# Patient Record
Sex: Female | Born: 1935 | Race: White | Hispanic: No | Smoking: Never smoker
Health system: Southern US, Community
[De-identification: ages and names within clinical notes are randomized; demographics above are authoritative.]

## PROBLEM LIST (undated history)

## (undated) DIAGNOSIS — F419 Anxiety disorder, unspecified: Secondary | ICD-10-CM

## (undated) DIAGNOSIS — T7840XA Allergy, unspecified, initial encounter: Secondary | ICD-10-CM

## (undated) DIAGNOSIS — F0390 Unspecified dementia without behavioral disturbance: Secondary | ICD-10-CM

## (undated) DIAGNOSIS — E785 Hyperlipidemia, unspecified: Secondary | ICD-10-CM

## (undated) DIAGNOSIS — F32A Depression, unspecified: Secondary | ICD-10-CM

## (undated) DIAGNOSIS — H269 Unspecified cataract: Secondary | ICD-10-CM

## (undated) DIAGNOSIS — M1990 Unspecified osteoarthritis, unspecified site: Secondary | ICD-10-CM

## (undated) HISTORY — DX: Anxiety disorder, unspecified: F41.9

## (undated) HISTORY — PX: MINOR HEMORRHOIDECTOMY: SHX6238

## (undated) HISTORY — DX: Unspecified dementia, unspecified severity, without behavioral disturbance, psychotic disturbance, mood disturbance, and anxiety: F03.90

## (undated) HISTORY — PX: TUBAL LIGATION: SHX77

## (undated) HISTORY — DX: Allergy, unspecified, initial encounter: T78.40XA

## (undated) HISTORY — DX: Hyperlipidemia, unspecified: E78.5

## (undated) HISTORY — DX: Depression, unspecified: F32.A

## (undated) HISTORY — DX: Unspecified osteoarthritis, unspecified site: M19.90

## (undated) HISTORY — DX: Unspecified cataract: H26.9

---

## 2016-10-05 DIAGNOSIS — M1712 Unilateral primary osteoarthritis, left knee: Secondary | ICD-10-CM | POA: Insufficient documentation

## 2017-08-05 DIAGNOSIS — H2511 Age-related nuclear cataract, right eye: Secondary | ICD-10-CM | POA: Insufficient documentation

## 2018-10-23 ENCOUNTER — Ambulatory Visit: Payer: Medicare Other | Admitting: Licensed Clinical Social Worker

## 2018-10-23 ENCOUNTER — Other Ambulatory Visit: Payer: Self-pay

## 2018-10-29 ENCOUNTER — Ambulatory Visit: Payer: Medicare Other | Admitting: Licensed Clinical Social Worker

## 2018-10-29 ENCOUNTER — Other Ambulatory Visit: Payer: Self-pay

## 2018-11-25 ENCOUNTER — Ambulatory Visit: Payer: Self-pay | Admitting: Psychiatry

## 2018-12-17 ENCOUNTER — Other Ambulatory Visit: Payer: Self-pay

## 2018-12-17 ENCOUNTER — Ambulatory Visit (INDEPENDENT_AMBULATORY_CARE_PROVIDER_SITE_OTHER): Payer: Medicare Other | Admitting: Psychiatry

## 2018-12-17 ENCOUNTER — Encounter: Payer: Self-pay | Admitting: Psychiatry

## 2018-12-17 DIAGNOSIS — F330 Major depressive disorder, recurrent, mild: Secondary | ICD-10-CM | POA: Insufficient documentation

## 2018-12-17 DIAGNOSIS — F320 Major depressive disorder, single episode, mild: Secondary | ICD-10-CM

## 2018-12-17 DIAGNOSIS — F419 Anxiety disorder, unspecified: Secondary | ICD-10-CM

## 2018-12-17 DIAGNOSIS — K219 Gastro-esophageal reflux disease without esophagitis: Secondary | ICD-10-CM | POA: Insufficient documentation

## 2018-12-17 DIAGNOSIS — E785 Hyperlipidemia, unspecified: Secondary | ICD-10-CM | POA: Insufficient documentation

## 2018-12-17 DIAGNOSIS — F324 Major depressive disorder, single episode, in partial remission: Secondary | ICD-10-CM | POA: Insufficient documentation

## 2018-12-17 DIAGNOSIS — I493 Ventricular premature depolarization: Secondary | ICD-10-CM | POA: Insufficient documentation

## 2018-12-17 DIAGNOSIS — N952 Postmenopausal atrophic vaginitis: Secondary | ICD-10-CM | POA: Insufficient documentation

## 2018-12-17 MED ORDER — MIRTAZAPINE 30 MG PO TABS: 30.0000 mg | ORAL_TABLET | Freq: Every day | ORAL | 1 refills | Status: DC

## 2018-12-17 NOTE — Progress Notes (Signed)
Virtual Visit via Video Note  I connected with Dorothy Olson on 12/17/18 at  3:00 PM EDT by a video enabled telemedicine application and verified that I am speaking with the correct person using two identifiers.   I discussed the limitations of evaluation and management by telemedicine and the availability of in person appointments. The patient expressed understanding and agreed to proceed.   I discussed the assessment and treatment plan with the patient. The patient was provided an opportunity to ask questions and all were answered. The patient agreed with the plan and demonstrated an understanding of the instructions.   The patient was advised to call back or seek an in-person evaluation if the symptoms worsen or if the condition fails to improve as anticipated.    Psychiatric Initial Adult Assessment   Patient Identification: Dorothy Olson MRN:  [id number] Date of Evaluation:  12/17/2018 Referral Source: Valma CavaMichelle Beckham MD Chief Complaint:   Chief Complaint    Establish Care; Depression; Other; Weight Loss     Visit Diagnosis:    ICD-10-CM   1. MDD (major depressive disorder), single episode, mild (HCC)  F32.0   2. Anxiety disorder, unspecified type  F41.9     History of Present Illness:  Dorothy Olson is an 83 year old Caucasian female, widowed , [place] with her daughter Judeth CornfieldStephanie, has a history of anxiety was evaluated by telemedicine today.  Patient being a limited historian, daughter Judeth CornfieldStephanie provided collateral information and assisted in the evaluation today.  Per daughter patient was very active up until March 2020.  After the COVID-19 crisis started, she started decompensating.  She was an active member of her church, would go out shopping, would go out and make friends, go out for dinner and other activities on a regular basis.  However currently she does not want to go out anywhere at all.  She is very withdrawn, does not talk much.  She has no motivation to do  anything.  She needs to be pushed to eat her meals, she lost a lot of weight.  She denies any suicidal thoughts.  She was recently started on mirtazapine on 10 August.  However they have not noticed much benefit from the medication yet.  She has a history of being on Xanax in the past.  She currently takes Xanax 1 mg at bedtime.  She was on a higher dosage previously.  However when the mirtazapine was added her dosage was decreased.  Daughter reports she is worried about patient at this time.  She hence brought her to stay with her temporarily while she gets better.  Patient currently reports sadness, some sleep issues, decreased appetite.  She denies any suicidality.  She denies any homicidality.  She denies any perceptual disturbances.  Patient reports she is anxious however does not elaborate on it.  She does have a history of anxiety in the past.  As documented above after the death of her husband in 2011 she was started on Xanax.  She saw a psychiatrist briefly however did not continue care at that time.  Patient denies any history of trauma.  Patient appeared to be alert, oriented to person place time and situation.  Patient also was able to answer 3 word questing for memory- immediate 3 out of 3, after 5 minutes 3 out of 3.  Her attention and focus appear to be good.    Patient denies any significant medical problems other than hyperlipidemia.  Patient denies any substance abuse problems.  Patient does report psychosocial  stressors of the COVID-19 outbreak, not being able to go to church, having a relationship break-up recently with a female friend that she used to be with.  She did not elaborate on it.  She does report she is sad about that.  Associated Signs/Symptoms: Depression Symptoms:  depressed mood, anhedonia, psychomotor retardation, fatigue, anxiety, decreased appetite, (Hypo) Manic Symptoms:  denies Anxiety Symptoms:  anxiety unspecified Psychotic Symptoms:  denies PTSD  Symptoms: Negative  Past Psychiatric History: Patient with history of anxiety-denies inpatient mental health admissions in the past.  Patient denies suicide attempts.  She was recently started on mirtazapine by her primary care provider.  Previous Psychotropic Medications: Yes Xanax, Remeron.  Substance Abuse History in the last 12 months:  No.  Consequences of Substance Abuse: Negative  Past Medical History:  Past Medical History:  Diagnosis Date  . Anxiety   . Hyperlipidemia     Past Surgical History:  Procedure Laterality Date  . TUBAL LIGATION      Family Psychiatric History: Denies history of mental health problems in her family.  Family History:  Family History  Problem Relation Age of Onset  . Mental illness Neg Hx     Social History:   Social History   Socioeconomic History  . Marital status: Widowed    Spouse name: Not on file  . Number of children: 2  . Years of education: Not on file  . Highest education level: Not on file  Occupational History  . Not on file  Social Needs  . Financial resource strain: Not hard at all  . Food insecurity    Worry: Never true    Inability: Never true  . Transportation needs    Medical: No    Non-medical: No  Tobacco Use  . Smoking status: Never Smoker  . Smokeless tobacco: Never Used  Substance and Sexual Activity  . Alcohol use: Not Currently    Frequency: Never  . Drug use: Not Currently  . Sexual activity: Not Currently  Lifestyle  . Physical activity    Days per week: 0 days    Minutes per session: 0 min  . Stress: To some extent  Relationships  . Social Musicianconnections    Talks on phone: Not on file    Gets together: Not on file    Attends religious service: More than 4 times per year    Active member of club or organization: No    Attends meetings of clubs or organizations: Never    Relationship status: Widowed  Other Topics Concern  . Not on file  Social History Narrative  . Not on file     Additional Social History: Patient is widowed.  She has a son and a daughter.  She graduated high school.  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  Allergies:   Allergies  Allergen Reactions  . Amoxicillin-Pot Clavulanate Other (See Comments)  . Celexa  [Citalopram Hydrobromide] Nausea And Vomiting  . Escitalopram Oxalate Other (See Comments)  . Amoxicillin Rash  . Bupropion Other (See Comments) and Nausea Only  . Citalopram Nausea Only  . Sulfa Antibiotics Rash    Metabolic Disorder Labs: No results found for: HGBA1C, MPG No results found for: PROLACTIN No results found for: CHOL, TRIG, HDL, CHOLHDL, VLDL, LDLCALC No results found for: TSH  Therapeutic Level Labs: No results found for: LITHIUM No results found for: CBMZ No results found for: VALPROATE  Current Medications: Current Outpatient  Medications  Medication Sig Dispense Refill  . ALPRAZolam (XANAX) 1 MG tablet     . calcium citrate-vitamin D (CITRACAL+D) 315-200 MG-UNIT tablet Take by mouth.    . estradiol (ESTRACE VAGINAL) 0.1 MG/GM vaginal cream apply a small amount to vagina at night weekly    . Multiple Vitamins-Minerals (PRESERVISION AREDS) CAPS     . omeprazole (PRILOSEC) 20 MG capsule 1 capsule    . Potassium 99 MG TABS 1 tablet    . vitamin E 400 UNIT capsule 1 capsule    . mirtazapine (REMERON) 30 MG tablet Take 1 tablet (30 mg total) by mouth at bedtime. 30 tablet 1   No current facility-administered medications for this visit.     Musculoskeletal: Strength & Muscle Tone: UTA Gait & Station: normal Patient leans: N/A  Psychiatric Specialty Exam: Review of Systems  Psychiatric/Behavioral: Positive for depression. The patient is nervous/anxious.   All other systems reviewed and are negative.   There were no vitals taken for this visit.There is no height or weight on file to calculate BMI.  General Appearance: Casual  Eye Contact:  Fair  Speech:   Clear and Coherent  Volume:  Normal  Mood:  Anxious and Depressed  Affect:  Appropriate  Thought Process:  Goal Directed and Descriptions of Associations: Intact  Orientation:  Full (Time, Place, and Person)  Thought Content:  Logical  Suicidal Thoughts:  No  Homicidal Thoughts:  No  Memory:  Immediate;   Fair Recent;   Fair Remote;   Fair  Judgement:  Fair  Insight:  Good  Psychomotor Activity:  Normal  Concentration:  Concentration: Fair and Attention Span: Fair  Recall:  [employer]Fair  Fund of Knowledge:Fair  Language: Fair  Akathisia:  No  Handed:  Right  AIMS (if indicated): UTA  Assets:  Communication Skills Desire for Improvement Housing Social Support  ADL's:  Intact  Cognition: WNL  Sleep:  Fair   Screenings:   Assessment and Plan: Dorothy Olson is an 83 year old Caucasian female, retired, lives currently with her daughter in [place], has a history of anxiety, hyperlipidemia was evaluated by telemedicine today.  Patient is currently struggling with psychosocial stressors of the COVID-19 outbreak, relationship struggles.  She denies suicidality.  She denies any substance abuse problems.  She denies family history of mental health problems.  She has good social support from her daughter with whom she stays now.  Patient will benefit from the following plan.  Plan MDD- unstable Increase mirtazapine to 30 mg p.o. nightly Discussed starting Abilify however discussed with patient to get an EKG.  Will order EKG.  She will get it from her primary medical doctor.  For anxiety disorder unspecified- unstable Mirtazapine will help.  We will get the following labs-vitamin B12, TSH.  Will get EKG as noted above.  Collateral information was obtained from daughter Judeth CornfieldStephanie as summarized above.  I have referred patient for [facility]  I have also left a message for Inetta Fermoina with patient's updated phone number.  Follow-up in  clinic in 2 to 3 weeks or sooner if needed.  September 17 at 3:30 PM  I have spent atleast 60 minutes non face to face with patient today. More than 50 % of the time was spent for psychoeducation and supportive psychotherapy and care coordination.  This note was generated in part or whole with voice recognition software. Voice recognition is usually quite accurate but there are transcription errors that can and very often do  occur. I apologize for any typographical errors that were not detected and corrected.        Ursula Alert, MD 8/19/20205:25 PM

## 2018-12-25 ENCOUNTER — Telehealth: Payer: Self-pay

## 2018-12-25 NOTE — Telephone Encounter (Signed)
patient has an appointment set up for 01-07-19 with her.

## 2019-01-12 ENCOUNTER — Ambulatory Visit: Payer: Medicare Other | Admitting: Licensed Clinical Social Worker

## 2019-01-15 ENCOUNTER — Encounter: Payer: Self-pay | Admitting: Psychiatry

## 2019-01-15 ENCOUNTER — Ambulatory Visit (INDEPENDENT_AMBULATORY_CARE_PROVIDER_SITE_OTHER): Payer: Medicare Other | Admitting: Psychiatry

## 2019-01-15 ENCOUNTER — Other Ambulatory Visit: Payer: Self-pay

## 2019-01-15 DIAGNOSIS — F419 Anxiety disorder, unspecified: Secondary | ICD-10-CM | POA: Diagnosis not present

## 2019-01-15 DIAGNOSIS — F320 Major depressive disorder, single episode, mild: Secondary | ICD-10-CM | POA: Insufficient documentation

## 2019-01-15 DIAGNOSIS — F1320 Sedative, hypnotic or anxiolytic dependence, uncomplicated: Secondary | ICD-10-CM | POA: Insufficient documentation

## 2019-01-15 MED ORDER — HYDROXYZINE HCL 25 MG PO TABS: 12.5000 mg | ORAL_TABLET | ORAL | 1 refills | Status: DC

## 2019-01-15 MED ORDER — HYDROXYZINE HCL 25 MG PO TABS: 12.5000 mg | ORAL_TABLET | ORAL | 0 refills | Status: DC

## 2019-01-15 NOTE — Progress Notes (Signed)
Virtual Visit via Video Note  I connected with Baron Sanearolyn Glaude on 01/15/19 at  3:30 PM EDT by a video enabled telemedicine application and verified that I am speaking with the correct person using two identifiers.   I discussed the limitations of evaluation and management by telemedicine and the availability of in person appointments. The patient expressed understanding and agreed to proceed.    I discussed the assessment and treatment plan with the patient. The patient was provided an opportunity to ask questions and all were answered. The patient agreed with the plan and demonstrated an understanding of the instructions.   The patient was advised to call back or seek an in-person evaluation if the symptoms worsen or if the condition fails to improve as anticipated.   BH MD OP Progress Note  01/15/2019 5:02 PM Baron SaneCarolyn Vanorder  MRN:  [id number]  Chief Complaint:  Chief Complaint    Follow-up     HPI: Dorothy Olson is an 83 year old Caucasian female, widowed, lives in [place] with her daughter Judeth CornfieldStephanie, has a history of depression, anxiety, benzodiazepine dependence was evaluated by telemedicine today.  Patient's daughter Judeth CornfieldStephanie provided collateral information.  Patient today responded to questions in short phrases.  She appeared to be a limited historian.  She however appeared to be alert, oriented to person place and situation.  Per Judeth CornfieldStephanie, her daughter who provided collateral information, patient was taking more Xanax than what was prescribed to her by her primary care provider.  Patient however ran out of her medication almost 2 weeks ago.  She took her last dosage of Xanax the Sunday prior to last one.  Patient initially had some withdrawal symptoms however she is currently making progress.  She does not seem to be anxious, does not have any tremors or shakes and does not appear to be confused.  She however does appear to have some sleep problems.  Last night she was sleeping and was  talking in her sleep.  She also was responding to certain questions in her sleep when the daughter spoke to her, hence it is likely that she may not have been in a deep sleep either.  Patient was not taking her mirtazapine as prescribed.  She however started taking them again 2 weeks ago.  She however did skip her dosages in between.  She has started seeing the therapist Ms. Felecia Janina Thompson and reports therapy sessions is going well.  Per daughter patient likely making some progress, she seems to be more sharp now, now that she is off of the benzodiazepine.  Patient denies any suicidality, homicidality or perceptual disturbances.    Visit Diagnosis:    ICD-10-CM   1. MDD (major depressive disorder), single episode, mild (HCC)  F32.0   2. Anxiety disorder, unspecified type  F41.9 hydrOXYzine (ATARAX/VISTARIL) 25 MG tablet    DISCONTINUED: hydrOXYzine (ATARAX/VISTARIL) 25 MG tablet  3. Benzodiazepine dependence (HCC)  F13.20     Past Psychiatric History: I have reviewed past psychiatric history from my progress note on 12/17/2018.  Past trials of Xanax, mirtazapine  Past Medical History:  Past Medical History:  Diagnosis Date  . Anxiety   . Hyperlipidemia     Past Surgical History:  Procedure Laterality Date  . TUBAL LIGATION      Family Psychiatric History: I have reviewed family psychiatric history from my progress note on 12/17/2018 Family History:  Family History  Problem Relation Age of Onset  . Mental illness Neg Hx     Social History: I have reviewed  social history from my progress note on 12/17/2018 Social History   Socioeconomic History  . Marital status: Widowed    Spouse name: Not on file  . Number of children: 2  . Years of education: Not on file  . Highest education level: Not on file  Occupational History  . Not on file  Social Needs  . Financial resource strain: Not hard at all  . Food insecurity    Worry: Never true    Inability: Never true  .  Transportation needs    Medical: No    Non-medical: No  Tobacco Use  . Smoking status: Never Smoker  . Smokeless tobacco: Never Used  Substance and Sexual Activity  . Alcohol use: Not Currently    Frequency: Never  . Drug use: Not Currently  . Sexual activity: Not Currently  Lifestyle  . Physical activity    Days per week: 0 days    Minutes per session: 0 min  . Stress: To some extent  Relationships  . Social Herbalist on phone: Not on file    Gets together: Not on file    Attends religious service: More than 4 times per year    Active member of club or organization: No    Attends meetings of clubs or organizations: Never    Relationship status: Widowed  Other Topics Concern  . Not on file  Social History Narrative  . Not on file    Allergies:  Allergies  Allergen Reactions  . Amoxicillin-Pot Clavulanate Other (See Comments)  . Celexa  [Citalopram Hydrobromide] Nausea And Vomiting  . Escitalopram Oxalate Other (See Comments)  . Amoxicillin Rash  . Bupropion Other (See Comments) and Nausea Only  . Citalopram Nausea Only  . Sulfa Antibiotics Rash    Metabolic Disorder Labs: No results found for: HGBA1C, MPG No results found for: PROLACTIN No results found for: CHOL, TRIG, HDL, CHOLHDL, VLDL, LDLCALC No results found for: TSH  Therapeutic Level Labs: No results found for: LITHIUM No results found for: VALPROATE No components found for:  CBMZ  Current Medications: Current Outpatient Medications  Medication Sig Dispense Refill  . calcium citrate-vitamin D (CITRACAL+D) 315-200 MG-UNIT tablet Take by mouth.    . estradiol (ESTRACE VAGINAL) 0.1 MG/GM vaginal cream apply a small amount to vagina at night weekly    . hydrOXYzine (ATARAX/VISTARIL) 25 MG tablet Take 0.5 tablets (12.5 mg total) by mouth as directed. Only 3-4 times a week as needed for severe anxiety attacks 15 tablet 1  . mirtazapine (REMERON) 30 MG tablet Take 1 tablet (30 mg total) by mouth  at bedtime. 30 tablet 1  . Multiple Vitamins-Minerals (PRESERVISION AREDS) CAPS     . omeprazole (PRILOSEC) 20 MG capsule 1 capsule    . Potassium 99 MG TABS 1 tablet    . vitamin E 400 UNIT capsule 1 capsule     No current facility-administered medications for this visit.      Musculoskeletal: Strength & Muscle Tone: UTA Gait & Station: Observed as seated Patient leans: N/A  Psychiatric Specialty Exam: Review of Systems  Psychiatric/Behavioral: The patient is nervous/anxious and has insomnia.   All other systems reviewed and are negative.   There were no vitals taken for this visit.There is no height or weight on file to calculate BMI.  General Appearance: Casual  Eye Contact:  Fair  Speech:  Clear and Coherent  Volume:  Normal  Mood:  Anxious  Affect:  Congruent  Thought Process:  Goal Directed and Descriptions of Associations: Intact  Orientation:  Full (Time, Place, and Person)  Thought Content: Logical   Suicidal Thoughts:  No  Homicidal Thoughts:  No  Memory:  Immediate;   Fair Recent;   Fair Remote;   Fair  Judgement:  Fair  Insight:  Fair  Psychomotor Activity:  Normal  Concentration:  Concentration: Fair and Attention Span: Fair  Recall:  [employer] of Knowledge: Fair  Language: Fair  Akathisia:  No  Handed:  Right  AIMS (if indicated): denies tremors, rigidity  Assets:  Communication Skills Desire for Improvement Social Support  ADL's:  Intact  Cognition: WNL  Sleep:  restless   Screenings:   Assessment and Plan: Asenet is an 83 year old Caucasian female, retired, lives currently with her daughter in [place], has a history of depression, anxiety, hyperlipidemia, possible benzodiazepine dependence with current withdrawal symptoms was evaluated by telemedicine today.  Patient with noncompliance on recent medication recommendations continues to struggle with anxiety.  She also may have been misusing her benzodiazepines and is currently not on it.   Collateral information was obtained from daughter.  Discussed plan as noted below.  Plan MDD-unstable Mirtazapine 30 mg p.o. nightly. Patient recently started taking it.  We will give it more time.   Anxiety disorder unspecified-unstable. Patient likely having anxiety due to not being on benzodiazepines anymore.  She stopped taking it almost 2 weeks ago.  She may have been misusing it and was taking more than what was prescribed.  She currently does not have any significant withdrawal symptoms except for some sleep problems. Patient to be monitored closely. Add hydroxyzine 12.5 mg as needed for severe anxiety symptoms.   For benzodiazepine dependence currently in remission- Will monitor closely.  Collateral information was obtained from daughter Judeth Cornfield as summarized above.  Patient to continue psychotherapy sessions with Ms. Felecia Jan.  Follow-up in clinic in 3  weeks or sooner if needed.  October 5 at 1:30 PM  I have spent atleast 25 minutes non  face to face with patient today. More than 50 % of the time was spent for psychoeducation and supportive psychotherapy and care coordination. This note was generated in part or whole with voice recognition software. Voice recognition is usually quite accurate but there are transcription errors that can and very often do occur. I apologize for any typographical errors that were not detected and corrected.      Jomarie Longs, MD 01/15/2019, 5:02 PM

## 2019-01-29 ENCOUNTER — Telehealth: Payer: Self-pay

## 2019-01-29 NOTE — Telephone Encounter (Signed)
Thanks

## 2019-01-29 NOTE — Telephone Encounter (Signed)
Pt has appt with tina thompson, lcsw on  01-07-19  Referral # [id number]

## 2019-01-29 NOTE — Telephone Encounter (Signed)
received fax that Dorothy Olson was set up on  01-07-19 ,  01-22-19 and 02-12-19.

## 2019-02-02 ENCOUNTER — Encounter: Payer: Self-pay | Admitting: Psychiatry

## 2019-02-02 ENCOUNTER — Ambulatory Visit (INDEPENDENT_AMBULATORY_CARE_PROVIDER_SITE_OTHER): Payer: Medicare Other | Admitting: Psychiatry

## 2019-02-02 ENCOUNTER — Other Ambulatory Visit: Payer: Self-pay

## 2019-02-02 DIAGNOSIS — F320 Major depressive disorder, single episode, mild: Secondary | ICD-10-CM

## 2019-02-02 DIAGNOSIS — F419 Anxiety disorder, unspecified: Secondary | ICD-10-CM | POA: Diagnosis not present

## 2019-02-02 DIAGNOSIS — F1320 Sedative, hypnotic or anxiolytic dependence, uncomplicated: Secondary | ICD-10-CM | POA: Diagnosis not present

## 2019-02-02 MED ORDER — MIRTAZAPINE 15 MG PO TABS: 22.5000 mg | ORAL_TABLET | Freq: Every day | ORAL | 1 refills | Status: DC

## 2019-02-02 NOTE — Progress Notes (Signed)
Virtual Visit via Video Note  I connected with Dorothy Olson on 02/02/19 at  1:30 PM EDT by a video enabled telemedicine application and verified that I am speaking with the correct person using two identifiers.   I discussed the limitations of evaluation and management by telemedicine and the availability of in person appointments. The patient expressed understanding and agreed to proceed.     I discussed the assessment and treatment plan with the patient. The patient was provided an opportunity to ask questions and all were answered. The patient agreed with the plan and demonstrated an understanding of the instructions.   The patient was advised to call back or seek an in-person evaluation if the symptoms worsen or if the condition fails to improve as anticipated.  [facility] MD OP Progress Note  02/02/2019 5:32 PM Candia Kingsbury  MRN:  [id number]  Chief Complaint:  Chief Complaint    Follow-up     HPI: Dorothy Olson Media) is a 83 year old Caucasian female, widowed, lives in [place] with her daughter Colletta [place], has a history of depression, anxiety, benzodiazepine dependence was evaluated by telemedicine today.  Patient today reports that she continues to feel depressed or anxious.  She however reports her appetite may have improved.  She also reports sleep is good.  She does not have any significant dreams anymore and she does not bother her daughter as she used to before.  That is an improvement.  She however reports the mirtazapine makes her feel groggy during the day.  She takes it early around 7 PM.  Inspite of that she feels groggy when she wakes up in the morning.  Patient denies any suicidality, homicidality or perceptual disturbances.  Patient appeared to be alert, oriented to person place time and situation.  She has started working with her therapist Ms. Miguel Dibble.  She had 2 visits with her therapist.  Patient denies any other concerns today. Visit Diagnosis:    ICD-10-CM    1. MDD (major depressive disorder), single episode, mild (HCC)  F32.0 mirtazapine (REMERON) 15 MG tablet  2. Anxiety disorder, unspecified type  F41.9 mirtazapine (REMERON) 15 MG tablet   likely due to BZD withdrawal  3. Benzodiazepine dependence ([place])  F13.20     Past Psychiatric History: I have reviewed past psychiatric history from my progress note on 12/17/2018.  Past trials of Xanax, mirtazapine  Past Medical History:  Past Medical History:  Diagnosis Date  . Anxiety   . Hyperlipidemia     Past Surgical History:  Procedure Laterality Date  . TUBAL LIGATION      Family Psychiatric History: I have reviewed family psychiatric history from my progress note on 12/17/2018  Family History:  Family History  Problem Relation Age of Onset  . Mental illness Neg Hx     Social History: I have reviewed social history from my progress note on 12/17/2018 Social History   Socioeconomic History  . Marital status: Widowed    Spouse name: Not on file  . Number of children: 2  . Years of education: Not on file  . Highest education level: Not on file  Occupational History  . Not on file  Social Needs  . Financial resource strain: Not hard at all  . Food insecurity    Worry: Never true    Inability: Never true  . Transportation needs    Medical: No    Non-medical: No  Tobacco Use  . Smoking status: Never Smoker  . Smokeless tobacco: Never Used  Substance and Sexual Activity  . Alcohol use: Not Currently    Frequency: Never  . Drug use: Not Currently  . Sexual activity: Not Currently  Lifestyle  . Physical activity    Days per week: 0 days    Minutes per session: 0 min  . Stress: To some extent  Relationships  . Social Musicianconnections    Talks on phone: Not on file    Gets together: Not on file    Attends religious service: More than 4 times per year    Active member of club or organization: No    Attends meetings of clubs or organizations: Never    Relationship status:  Widowed  Other Topics Concern  . Not on file  Social History Narrative  . Not on file    Allergies:  Allergies  Allergen Reactions  . Amoxicillin-Pot Clavulanate Other (See Comments)  . Celexa  [Citalopram Hydrobromide] Nausea And Vomiting  . Escitalopram Oxalate Other (See Comments)  . Amoxicillin Rash  . Bupropion Other (See Comments) and Nausea Only  . Citalopram Nausea Only  . Sulfa Antibiotics Rash    Metabolic Disorder Labs: No results found for: HGBA1C, MPG No results found for: PROLACTIN No results found for: CHOL, TRIG, HDL, CHOLHDL, VLDL, LDLCALC No results found for: TSH  Therapeutic Level Labs: No results found for: LITHIUM No results found for: VALPROATE No components found for:  CBMZ  Current Medications: Current Outpatient Medications  Medication Sig Dispense Refill  . calcium citrate-vitamin D (CITRACAL+D) 315-200 MG-UNIT tablet Take by mouth.    . estradiol (ESTRACE VAGINAL) 0.1 MG/GM vaginal cream apply a small amount to vagina at night weekly    . hydrOXYzine (ATARAX/VISTARIL) 25 MG tablet Take 0.5 tablets (12.5 mg total) by mouth as directed. Only 3-4 times a week as needed for severe anxiety attacks 15 tablet 1  . mirtazapine (REMERON) 15 MG tablet Take 1.5 tablets (22.5 mg total) by mouth at bedtime. 45 tablet 1  . mirtazapine (REMERON) 30 MG tablet Take 1 tablet (30 mg total) by mouth at bedtime. 30 tablet 1  . Multiple Vitamins-Minerals (PRESERVISION AREDS) CAPS     . omeprazole (PRILOSEC) 20 MG capsule 1 capsule    . Potassium 99 MG TABS 1 tablet    . vitamin E 400 UNIT capsule 1 capsule     No current facility-administered medications for this visit.      Musculoskeletal: Strength & Muscle Tone: UTA Gait & Station: normal Patient leans: N/A  Psychiatric Specialty Exam: Review of Systems  Psychiatric/Behavioral: The patient is nervous/anxious.   All other systems reviewed and are negative.   There were no vitals taken for this  visit.There is no height or weight on file to calculate BMI.  General Appearance: Casual  Eye Contact:  Fair  Speech:  Normal Rate  Volume:  Normal  Mood:  Anxious  Affect:  Congruent  Thought Process:  Goal Directed and Descriptions of Associations: Intact  Orientation:  Full (Time, Place, and Person)  Thought Content: Logical   Suicidal Thoughts:  No  Homicidal Thoughts:  No  Memory:  Immediate;   Fair Recent;   Fair Remote;   Fair  Judgement:  Fair  Insight:  Fair  Psychomotor Activity:  Normal  Concentration:  Concentration: Fair and Attention Span: Fair  Recall:  [employer]  Fund of Knowledge: Fair  Language: Fair  Akathisia:  No  Handed:  Right  AIMS (if indicated): denies tremors, rigidity  Assets:  Communication Skills Desire  for Improvement Housing Social Support  ADL's:  Intact  Cognition: WNL  Sleep:  Improving   Screenings:   Assessment and Plan: Dorothy Olson is an 83 year old Caucasian female, retired, lives currently with her daughter in [place], has a history of depression, anxiety, hyperlipidemia, possible benzodiazepine dependence was evaluated by telemedicine today.  Patient with history of noncompliance with medications however is more compliant at this time.  Patient continues to struggle with depression and anxiety.  She will continue to benefit from medications as well as psychotherapy sessions.  Plan MDD- some progress Reduce mirtazapine to 22.5 mg p.o. nightly for side effects Continue psychotherapy sessions with therapist Ms. Felecia Jan.  Anxiety disorder unspecified- likely due to benzodiazepine withdrawal We will continue to monitor closely. Continue CBT She has hydroxyzine 12.5 mg daily PRN as needed.  She has not been using it.  For benzodiazepine dependence currently in remission-we will monitor closely  Patient to continue psychotherapy sessions with Ms. Felecia Jan.  Follow-up in clinic in 3 to 4 weeks or sooner if needed.  November 5  at 1 PM  I have spent atleast 15 minutes non  face to face with patient today. More than 50 % of the time was spent for psychoeducation and supportive psychotherapy and care coordination. This note was generated in part or whole with voice recognition software. Voice recognition is usually quite accurate but there are transcription errors that can and very often do occur. I apologize for any typographical errors that were not detected and corrected.     Jomarie Longs, MD 02/02/2019, 5:32 PM

## 2019-02-04 ENCOUNTER — Other Ambulatory Visit: Payer: Self-pay | Admitting: Psychiatry

## 2019-02-04 DIAGNOSIS — F419 Anxiety disorder, unspecified: Secondary | ICD-10-CM

## 2019-03-03 ENCOUNTER — Other Ambulatory Visit: Payer: Self-pay | Admitting: Psychiatry

## 2019-03-03 DIAGNOSIS — F419 Anxiety disorder, unspecified: Secondary | ICD-10-CM

## 2019-03-03 DIAGNOSIS — F320 Major depressive disorder, single episode, mild: Secondary | ICD-10-CM

## 2019-03-05 ENCOUNTER — Encounter: Payer: Self-pay | Admitting: Psychiatry

## 2019-03-05 ENCOUNTER — Other Ambulatory Visit: Payer: Self-pay

## 2019-03-05 ENCOUNTER — Ambulatory Visit (INDEPENDENT_AMBULATORY_CARE_PROVIDER_SITE_OTHER): Payer: Medicare Other | Admitting: Psychiatry

## 2019-03-05 DIAGNOSIS — F320 Major depressive disorder, single episode, mild: Secondary | ICD-10-CM | POA: Diagnosis not present

## 2019-03-05 DIAGNOSIS — F1321 Sedative, hypnotic or anxiolytic dependence, in remission: Secondary | ICD-10-CM | POA: Diagnosis not present

## 2019-03-05 DIAGNOSIS — F419 Anxiety disorder, unspecified: Secondary | ICD-10-CM | POA: Diagnosis not present

## 2019-03-05 NOTE — Progress Notes (Signed)
Virtual Visit via Video Note  I connected with Dorothy Olson on 03/05/19 at  1:00 PM EST by a video enabled telemedicine application and verified that I am speaking with the correct person using two identifiers.   I discussed the limitations of evaluation and management by telemedicine and the availability of in person appointments. The patient expressed understanding and agreed to proceed.     I discussed the assessment and treatment plan with the patient. The patient was provided an opportunity to ask questions and all were answered. The patient agreed with the plan and demonstrated an understanding of the instructions.   The patient was advised to call back or seek an in-person evaluation if the symptoms worsen or if the condition fails to improve as anticipated.   [facility] MD OP Progress Note  03/05/2019 1:28 PM Dorothy Olson  MRN:  [id number]  Chief Complaint:  Chief Complaint    Follow-up     HPI: Dorothy Olson)  is an 83 year old Caucasian female, widowed, lives in [place] with her daughter, Judeth Cornfield, has a history of MDD, anxiety disorder was evaluated by telemedicine today.  Writer initially attempted to contact the number in chart however daughter Judeth Cornfield picked up the phone and requested to contact her husband at [phone] to connect with her mother.  Patient today reports she is doing a little bit better with regards to her depression and anxiety.  She reports she has been making use of her support system.  She is trying to walk and get some moderate exercise when possible.  She reports her appetite has improved.  She however was started on Megace by her primary care provider which could also have helped.  Patient reports sleep is good.  Currently denies any vivid dreams or nightmares which she struggle with in the past.  She reports she stopped going to the therapist since she does not feel it is helpful.  She currently does not want to find a new therapist and reports  she is okay.  Patient appeared to be alert, oriented and was able to answer questions appropriately.  Patient denies any suicidality, homicidality or perceptual disturbances.  Patient denies any other concerns today.   Visit Diagnosis:    ICD-10-CM   1. MDD (major depressive disorder), single episode, mild (HCC)  F32.0    improving  2. Anxiety disorder, unspecified type  F41.9    likely due to BZD withdrawal - improving  3. Benzodiazepine dependence in remission (HCC)  F13.21     Past Psychiatric History: I have reviewed past psychiatric history from my progress note on 12/17/2018-Xanax, Remeron  Past Medical History:  Past Medical History:  Diagnosis Date  . Anxiety   . Hyperlipidemia     Past Surgical History:  Procedure Laterality Date  . TUBAL LIGATION      Family Psychiatric History: Reviewed family psychiatric history from my progress note on 12/17/2018 Family History:  Family History  Problem Relation Age of Onset  . Mental illness Neg Hx     Social History: Reviewed social history from my progress note on 12/17/2018 Social History   Socioeconomic History  . Marital status: Widowed    Spouse name: Not on file  . Number of children: 2  . Years of education: Not on file  . Highest education level: Not on file  Occupational History  . Not on file  Social Needs  . Financial resource strain: Not hard at all  . Food insecurity    Worry: Never true  Inability: Never true  . Transportation needs    Medical: No    Non-medical: No  Tobacco Use  . Smoking status: Never Smoker  . Smokeless tobacco: Never Used  Substance and Sexual Activity  . Alcohol use: Not Currently    Frequency: Never  . Drug use: Not Currently  . Sexual activity: Not Currently  Lifestyle  . Physical activity    Days per week: 0 days    Minutes per session: 0 min  . Stress: To some extent  Relationships  . Social Musicianconnections    Talks on phone: Not on file    Gets together: Not on  file    Attends religious service: More than 4 times per year    Active member of club or organization: No    Attends meetings of clubs or organizations: Never    Relationship status: Widowed  Other Topics Concern  . Not on file  Social History Narrative  . Not on file    Allergies:  Allergies  Allergen Reactions  . Amoxicillin-Pot Clavulanate Other (See Comments)  . Citalopram Hydrobromide Nausea And Vomiting and Nausea Only  . Escitalopram Oxalate Other (See Comments)  . Pristiq  [Desvenlafaxine Succinate Er] Other (See Comments)  . Amoxicillin Rash  . Bupropion Other (See Comments) and Nausea Only  . Citalopram Nausea Only  . Sulfa Antibiotics Rash    Metabolic Disorder Labs: No results found for: HGBA1C, MPG No results found for: PROLACTIN No results found for: CHOL, TRIG, HDL, CHOLHDL, VLDL, LDLCALC No results found for: TSH  Therapeutic Level Labs: No results found for: LITHIUM No results found for: VALPROATE No components found for:  CBMZ  Current Medications: Current Outpatient Medications  Medication Sig Dispense Refill  . calcium citrate-vitamin D (CITRACAL+D) 315-200 MG-UNIT tablet Take by mouth.    . estradiol (ESTRACE VAGINAL) 0.1 MG/GM vaginal cream apply a small amount to vagina at night weekly    . ezetimibe-simvastatin (VYTORIN) 10-20 MG tablet Take 1 tablet by mouth daily.    . hydrOXYzine (ATARAX/VISTARIL) 25 MG tablet TAKE 0.5 TABLETS BY MOUTH AS DIRECTED. ONLY 3-4 TIMES A WEEK AS NEEDED FOR SEVERE ANXIETY ATTACKS 45 tablet 1  . megestrol (MEGACE) 40 MG/ML suspension 10 ml    . megestrol (MEGACE) 40 MG/ML suspension Take 400 mg by mouth daily.    . mirtazapine (REMERON) 15 MG tablet TAKE 1.5 TABLETS (22.5 MG TOTAL) BY MOUTH AT BEDTIME. 135 tablet 1  . Multiple Vitamins-Minerals (PRESERVISION AREDS) CAPS     . omeprazole (PRILOSEC) 20 MG capsule 1 capsule    . Potassium 99 MG TABS 1 tablet    . vitamin E 400 UNIT capsule 1 capsule     No current  facility-administered medications for this visit.      Musculoskeletal: Strength & Muscle Tone: UTA Gait & Station: normal Patient leans: N/A  Psychiatric Specialty Exam: Review of Systems  Psychiatric/Behavioral: The patient is nervous/anxious.   All other systems reviewed and are negative.   There were no vitals taken for this visit.There is no height or weight on file to calculate BMI.  General Appearance: Casual  Eye Contact:  Fair  Speech:  Clear and Coherent  Volume:  Normal  Mood:  Anxious improving  Affect:  Congruent  Thought Process:  Goal Directed and Descriptions of Associations: Intact  Orientation:  Full (Time, Place, and Person)  Thought Content: Logical   Suicidal Thoughts:  No  Homicidal Thoughts:  No  Memory:  Immediate;  Fair Recent;   Fair Remote;   Fair  Judgement:  Fair  Insight:  Fair  Psychomotor Activity:  Normal  Concentration:  Concentration: Fair and Attention Span: Fair  Recall:  [employer] of Knowledge: Fair  Language: Fair  Akathisia:  No  Handed:  Right  AIMS (if indicated): Denies tremors, rigidity  Assets:  Communication Skills Desire for Improvement Housing Social Support Transportation  ADL's:  Intact  Cognition: WNL  Sleep:  Fair   Screenings:   Assessment and Plan: Keajah is an 83 year old Caucasian female, retired, lives currently with her daughter in [place], has a history of depression, anxiety, hyperlipidemia, possible benzodiazepine dependence currently in remission was evaluated by telemedicine today.  Patient with history of noncompliance with medication however is currently more compliant and is making progress.  Plan MDD-improving Mirtazapine at reduced dosage of 22.5 mg p.o. nightly.  She had side effects on higher dosage. Patient has been noncompliant with psychotherapy sessions.  She declines therapy referral.  Anxiety disorder unspecified-likely due to benzodiazepine withdrawal Improving.  We will  continue to monitor closely She also has hydroxyzine 12.5 mg daily as needed for anxiety symptoms.  She has been limiting use  For benzodiazepine dependence currently in remission-will monitor closely  Follow-up in clinic in 2 months or sooner if needed.  January 6 at 2:30 PM  I have spent atleast 15 minutes non face to face with patient today. More than 50 % of the time was spent for psychoeducation and supportive psychotherapy and care coordination. This note was generated in part or whole with voice recognition software. Voice recognition is usually quite accurate but there are transcription errors that can and very often do occur. I apologize for any typographical errors that were not detected and corrected.        Ursula Alert, MD 03/05/2019, 1:28 PM

## 2019-05-06 ENCOUNTER — Other Ambulatory Visit: Payer: Self-pay

## 2019-05-06 ENCOUNTER — Ambulatory Visit: Payer: Medicare Other | Admitting: Psychiatry

## 2019-08-05 ENCOUNTER — Other Ambulatory Visit: Payer: Self-pay | Admitting: Family Medicine

## 2019-08-05 ENCOUNTER — Ambulatory Visit: Admission: RE | Admit: 2019-08-05 | Payer: Self-pay | Source: Ambulatory Visit

## 2019-08-05 DIAGNOSIS — R634 Abnormal weight loss: Secondary | ICD-10-CM

## 2019-08-06 ENCOUNTER — Ambulatory Visit
Admission: RE | Admit: 2019-08-06 | Discharge: 2019-08-06 | Disposition: A | Discharge status: Home / Self Care | Payer: Medicare PPO | Source: Ambulatory Visit | Attending: Family Medicine | Admitting: Family Medicine

## 2019-08-06 ENCOUNTER — Other Ambulatory Visit: Payer: Self-pay

## 2019-08-06 DIAGNOSIS — K7689 Other specified diseases of liver: Secondary | ICD-10-CM | POA: Diagnosis not present

## 2019-08-06 DIAGNOSIS — K760 Fatty (change of) liver, not elsewhere classified: Secondary | ICD-10-CM | POA: Insufficient documentation

## 2019-08-06 DIAGNOSIS — R918 Other nonspecific abnormal finding of lung field: Secondary | ICD-10-CM | POA: Insufficient documentation

## 2019-08-06 DIAGNOSIS — R634 Abnormal weight loss: Secondary | ICD-10-CM | POA: Diagnosis not present

## 2019-08-06 DIAGNOSIS — N281 Cyst of kidney, acquired: Secondary | ICD-10-CM | POA: Insufficient documentation

## 2019-08-06 DIAGNOSIS — I700 Atherosclerosis of aorta: Secondary | ICD-10-CM | POA: Diagnosis not present

## 2019-08-06 MED ORDER — IOHEXOL 300 MG/ML  SOLN
75.0000 mL | Freq: Once | INTRAMUSCULAR | Status: AC | PRN
  Administered 2019-08-06: 75 mL via INTRAVENOUS

## 2019-09-03 ENCOUNTER — Other Ambulatory Visit: Payer: Self-pay | Admitting: Psychiatry

## 2019-09-03 DIAGNOSIS — F320 Major depressive disorder, single episode, mild: Secondary | ICD-10-CM

## 2019-09-03 DIAGNOSIS — F419 Anxiety disorder, unspecified: Secondary | ICD-10-CM

## 2019-09-04 ENCOUNTER — Other Ambulatory Visit: Payer: Self-pay | Admitting: Psychiatry

## 2019-09-04 DIAGNOSIS — F419 Anxiety disorder, unspecified: Secondary | ICD-10-CM

## 2019-09-04 DIAGNOSIS — F320 Major depressive disorder, single episode, mild: Secondary | ICD-10-CM

## 2019-09-04 MED ORDER — MIRTAZAPINE 15 MG PO TABS: 22.5000 mg | ORAL_TABLET | Freq: Every day | ORAL | 0 refills | Status: DC

## 2019-09-04 NOTE — Telephone Encounter (Signed)
We will send mirtazapine for 1 month supply.  Patient needs appointment prior to future refills.

## 2019-12-30 ENCOUNTER — Other Ambulatory Visit: Payer: Self-pay | Admitting: Family Medicine

## 2019-12-30 DIAGNOSIS — J849 Interstitial pulmonary disease, unspecified: Secondary | ICD-10-CM

## 2020-02-05 ENCOUNTER — Other Ambulatory Visit: Payer: Self-pay

## 2020-02-05 ENCOUNTER — Ambulatory Visit
Admission: RE | Admit: 2020-02-05 | Discharge: 2020-02-05 | Disposition: A | Discharge status: Home / Self Care | Payer: Medicare PPO | Source: Ambulatory Visit | Attending: Family Medicine | Admitting: Family Medicine

## 2020-02-05 DIAGNOSIS — J849 Interstitial pulmonary disease, unspecified: Secondary | ICD-10-CM | POA: Insufficient documentation

## 2020-04-14 ENCOUNTER — Other Ambulatory Visit: Payer: Self-pay | Admitting: Family Medicine

## 2020-04-14 ENCOUNTER — Other Ambulatory Visit (HOSPITAL_COMMUNITY): Payer: Self-pay | Admitting: Family Medicine

## 2020-04-14 DIAGNOSIS — R9389 Abnormal findings on diagnostic imaging of other specified body structures: Secondary | ICD-10-CM

## 2020-06-04 ENCOUNTER — Telehealth: Payer: Self-pay | Admitting: Family

## 2020-06-04 NOTE — Telephone Encounter (Signed)
Called to discuss with patient about COVID-19 symptoms and the use of one of the available treatments for those with mild to moderate Covid symptoms and at a high risk of hospitalization.  Pt appears to qualify for outpatient treatment due to co-morbid conditions and/or a member of an at-risk group in accordance with the FDA Emergency Use Authorization.    Symptom onset: 1/31 (not confirmed) Vaccinated:  Booster?  Immunocompromised?  Qualifiers: Age, hyperlipidemia   I attempted to contact Dorothy Olson to gather additional information and was unable to reach her. A voicemail with the call back number was left. No MyChart is available.   Marcos Eke, NP 06/04/2020 11:39 AM

## 2020-08-12 ENCOUNTER — Ambulatory Visit
Admission: RE | Admit: 2020-08-12 | Discharge: 2020-08-12 | Disposition: A | Discharge status: Home / Self Care | Payer: Medicare PPO | Source: Ambulatory Visit | Attending: Family Medicine | Admitting: Family Medicine

## 2020-08-12 ENCOUNTER — Other Ambulatory Visit: Payer: Self-pay

## 2020-08-12 DIAGNOSIS — R9389 Abnormal findings on diagnostic imaging of other specified body structures: Secondary | ICD-10-CM | POA: Insufficient documentation

## 2021-03-15 NOTE — Addendum Note (Signed)
Encounter addended by: Novella Olive on: 03/15/2021 3:09 PM  Actions taken: Letter saved

## 2021-07-07 ENCOUNTER — Other Ambulatory Visit: Payer: Self-pay | Admitting: Neurology

## 2021-07-07 DIAGNOSIS — R413 Other amnesia: Secondary | ICD-10-CM

## 2021-07-16 ENCOUNTER — Ambulatory Visit
Admission: RE | Admit: 2021-07-16 | Discharge: 2021-07-16 | Disposition: A | Discharge status: Home / Self Care | Payer: Medicare HMO | Source: Ambulatory Visit | Attending: Neurology | Admitting: Neurology

## 2021-07-16 DIAGNOSIS — R413 Other amnesia: Secondary | ICD-10-CM | POA: Diagnosis present

## 2021-07-16 MED ORDER — GADOBUTROL 1 MMOL/ML IV SOLN
5.0000 mL | Freq: Once | INTRAVENOUS | Status: AC | PRN
  Administered 2021-07-16: 5 mL via INTRAVENOUS

## 2021-07-19 IMAGING — CT CT CHEST HIGH RESOLUTION W/O CM
2 of 7 series · 12 of 36 positions shown, 15 images · non-contrast
Comparison: 08/06/2019

CLINICAL DATA: Follow-up possible ILD

EXAM:
CT CHEST WITHOUT CONTRAST
TECHNIQUE: Multidetector CT imaging of the chest was performed following the
standard protocol without intravenous contrast. High resolution
imaging of the lungs, as well as inspiratory and expiratory imaging,
was performed.

[Series 4: thorax 2.00 cor · coronal · 0.57mm/px · 3 of 124 slices shown]
[im 25/124  lung]
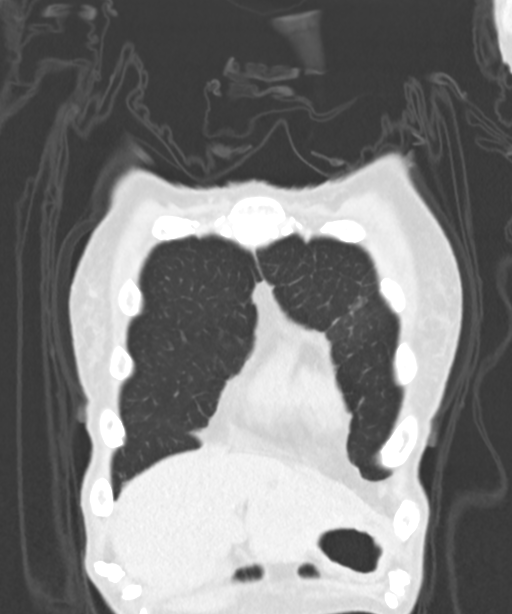
[im 50/124  lung]
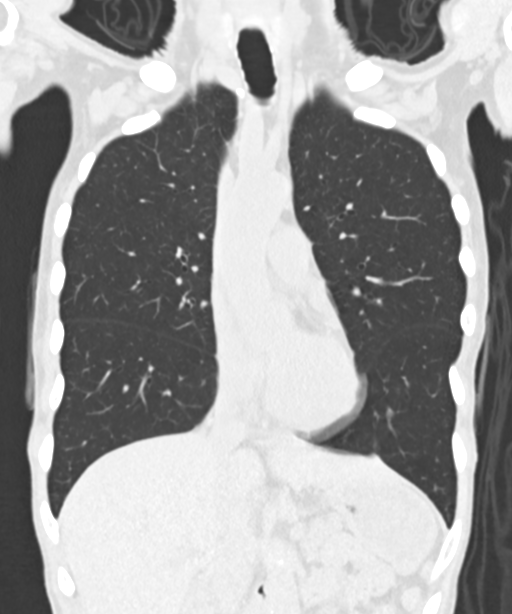
[im 74/124  lung]
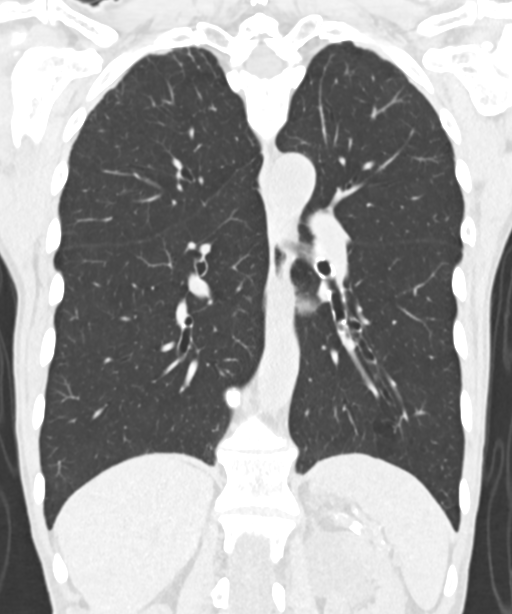

[Series 10: high res (id) thorax 1.00 ax · axial · 0.46mm/px · z∈[-1206,-909]mm · 9 of 352 slices shown, 12 images]
[im 28/352  mediastinal]
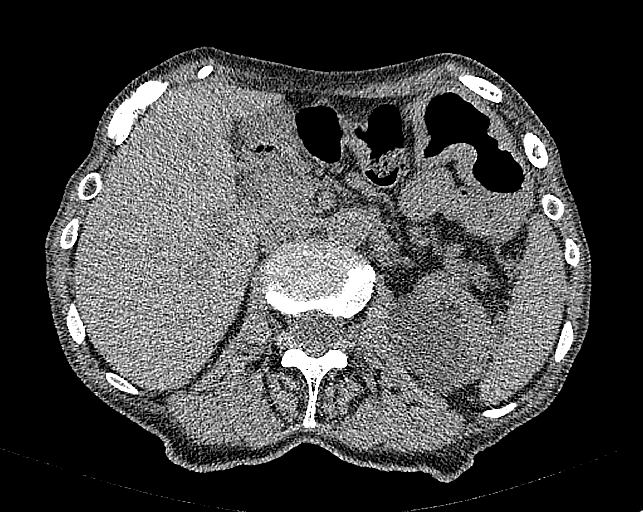
[im 28/352  lung]
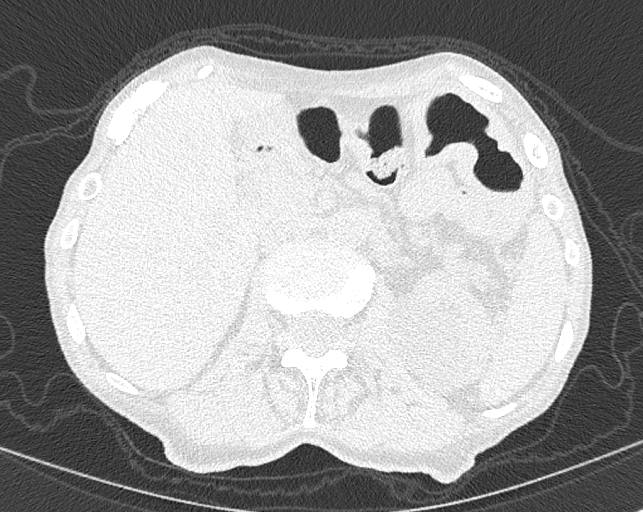
[im 82/352  lung]
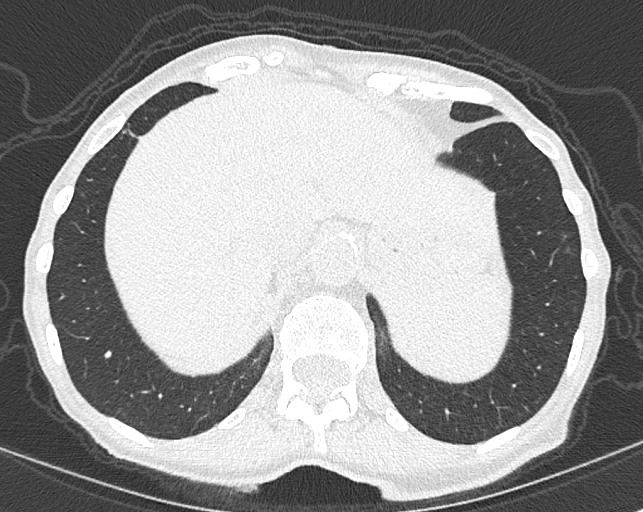
[im 109/352  lung]
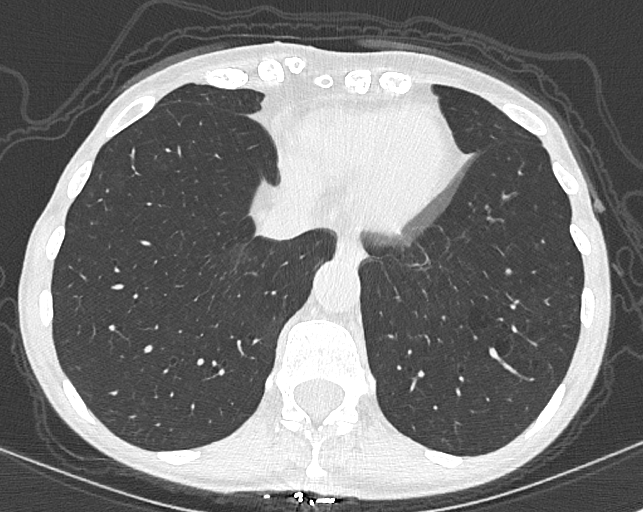
[im 136/352  lung]
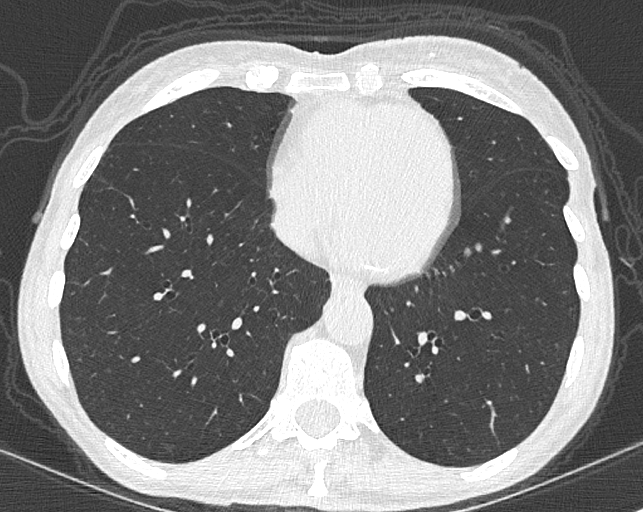
[im 190/352  mediastinal]
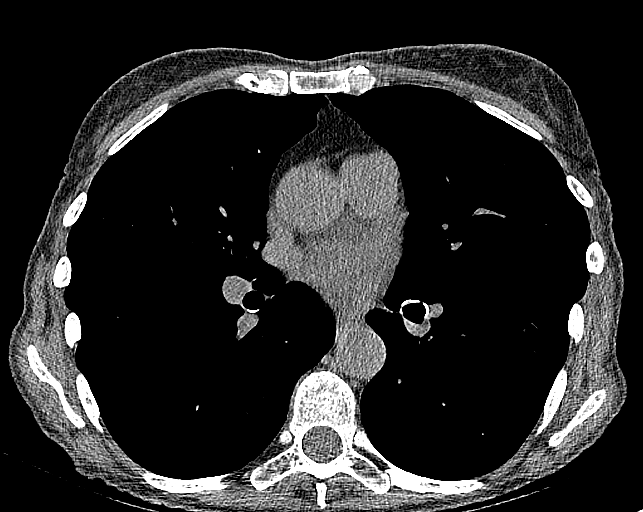
[im 190/352  lung]
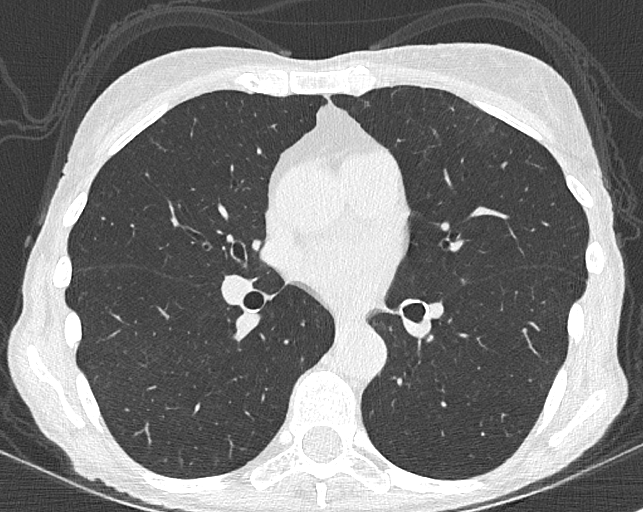
[im 217/352  lung]
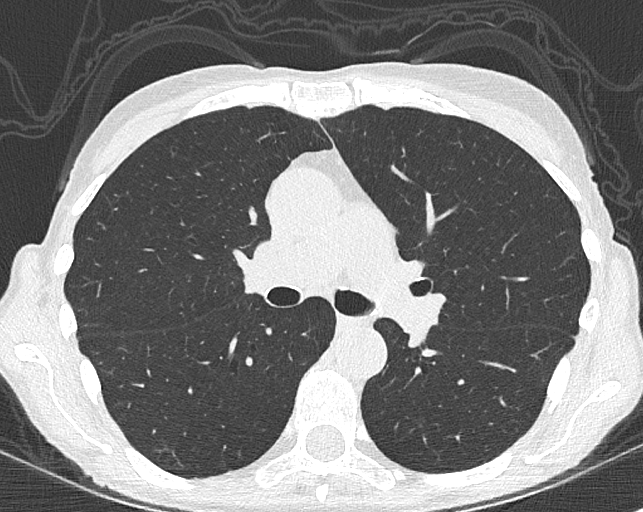
[im 244/352  lung]
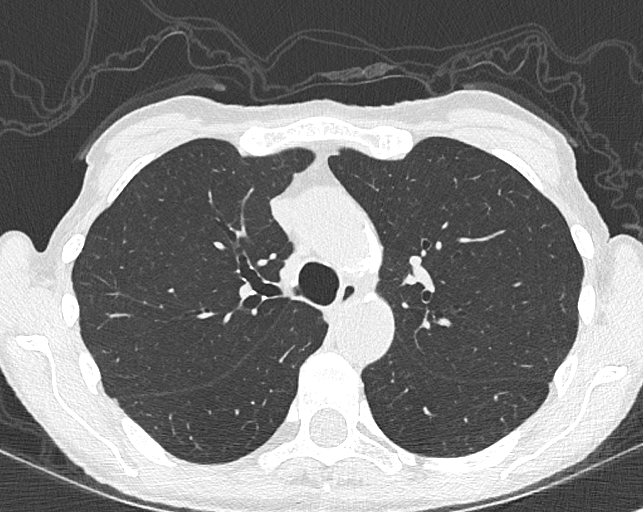
[im 298/352  lung]
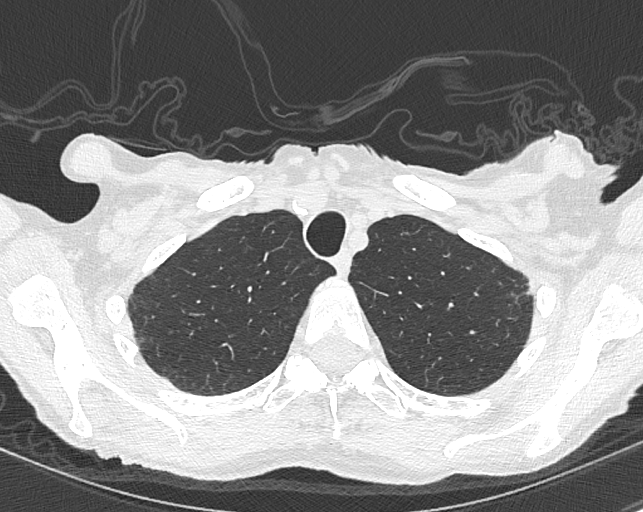
[im 325/352  mediastinal]
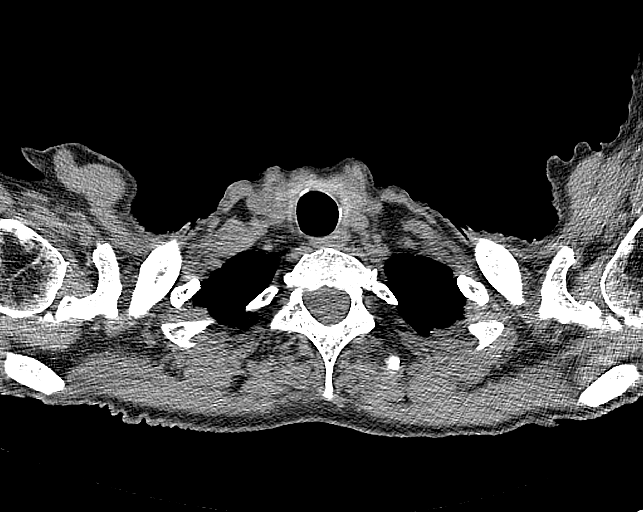
[im 325/352  lung]
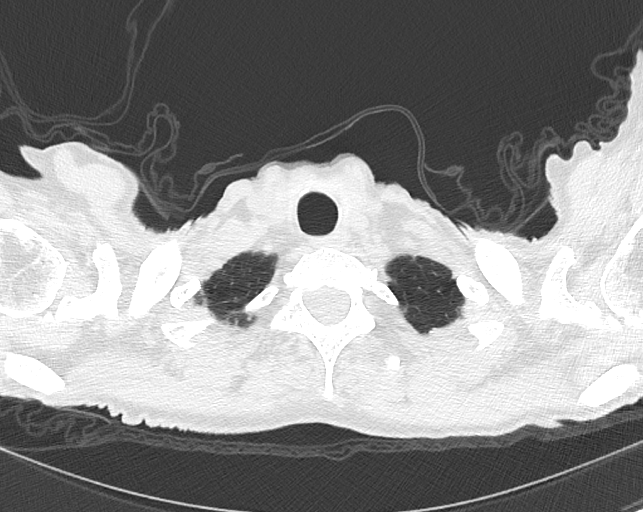

[12 of 36 positions shown; findings below may reference images not displayed]

FINDINGS: Cardiovascular: Aortic atherosclerosis. Normal heart size. Left and
right coronary artery calcifications. No pericardial effusion.

Mediastinum/Nodes: No enlarged mediastinal, hilar, or axillary lymph
nodes. Thyroid gland, trachea, and esophagus demonstrate no
significant findings.

Lungs/Pleura: Subpleural reticulation described on prior examination
is not appreciated. No significant air trapping on expiratory phase
imaging, however expiratory excursion is very limited. There are new
adjacent irregular pulmonary nodules of the peripheral right upper
lobe measuring 7 mm (series 10, image 64) and 6 mm (series 10, image
83). No pleural effusion or pneumothorax.

Upper Abdomen: No acute abnormality.

Musculoskeletal: No chest wall mass or suspicious bone lesions
identified.
IMPRESSION: 1. Subpleural reticulation described on prior examination is not
appreciated. No evidence of fibrotic interstitial lung disease. No
significant air trapping on expiratory phase imaging, however
expiratory excursion is very limited.
2. There are new adjacent irregular pulmonary nodules of the
peripheral right upper lobe measuring 7 mm and 6 mm, almost
certainly infectious or inflammatory given appearance and new
development. Non-contrast chest CT at 3-6 months is recommended to
ensure stability or resolution. If the nodules are stable at time of
repeat CT, then future CT at 18-24 months (from today's scan) is
considered optional for low-risk patients, but is recommended for
high-risk patients. This recommendation follows the consensus
statement: Guidelines for Management of Incidental Pulmonary Nodules
Detected on CT Images: From the [HOSPITAL] 9739; Radiology
3. Coronary artery disease.  Aortic Atherosclerosis ([id number]).

## 2022-06-22 ENCOUNTER — Encounter: Payer: Self-pay | Admitting: Dietician

## 2022-06-22 NOTE — Progress Notes (Signed)
Patient's daughter cancelled the scheduled follow up appt for 2.15.24; stated patient is doing well and does not need to return.

## 2022-06-22 NOTE — Progress Notes (Signed)
Medical Nutrition Therapy:  Documentation delayed due to issue with correct patient identification. Visit was 04/19/22 Visit start time: [id number]  end time: 1645  Assessment:   Referral Diagnosis: abnormal weight loss, mild protein-calorie malnutrition Other medical history/ diagnoses: psychodepressive dementia Psychosocial issues/ stress concerns: see above  Medications, supplements: Remeron, vitamin E, Memantine, preservision   Preferred learning method:  No preference indicated   Current weight: 96.5lbs Height: 5'7" BMI: 15.3   Progress and evaluation:  Daughter Lenox Ponds was present with patient during visit; patient lives with Colletta [place] and [place] plans and prepares meals. They have been working on pattern of weight gain by including Boost Max Protein drinks and other snacks, trying to increase caloric content of meals.  Patient reports some GI distress with some foods ie vegetables. Food allergies: none Special diet practices: none Patient seeks help with weight gain, adequate nutrition    Dietary Intake:  Usual eating pattern includes 3 meals and ? snacks per day. Who plans meals/ buys groceries? Daughter  Who prepares meals? daughter  Breakfast: cereal Snack:  Lunch:  Snack: Boost drink Supper: balanced meal-- protein + veg + starch Snack: boos drink Beverages: water, boost  Physical activity: no regular exercise   Intervention:   Nutrition Care Education:   Basic nutrition: basic food groups; appropriate nutrient balance; appropriate meal and snack schedule; general nutrition guidelines    Weight gain: importance of adequate nutrition for physical and mental health; increasing caloric density of foods/ meals by adding healthy fats ie salad dressings, oil, or mayo; including peanut butter with cereal or fruit for snacks, keeping foods convenient, close by; prioritizing order of foods eaten at a meal with higher calorie proteins and starches first, and low  calorie veg last; eating until feeling full and then pushing 2 more bites to increase volume tolerance Other:  eating cooked veg rather than raw, eating a small amount to prevent GI distress  Other intervention notes: Patient is making appropriate diet changes and is noticing some weight gain, with daughter's support Daughter voices concern with patient eating cereal daily, advised adding protein such as peanut butter, boiled eggs, or using protein enriched milk   Nutritional Diagnosis:  [place]-3.1 Underweight and [place]-3.2 Unintentional weight loss As related to low volume of food with meals, decreased memory and ability to prepare foods.  As evidenced by patient with current BMI of 15.3. [id number] Predicted suboptimal nutrient intake As related to history of missed meals/ infrequent eating, low volume of intake.  As evidenced by abnormal weight loss and low BMI.   Education Materials given:  Gaining [employer] in a Youth worker with food lists   Learner/ who was taught:  Patient  Family member: daughter Lenox Ponds   Level of understanding: Verbalizes/ demonstrates competency  Demonstrated degree of understanding via:   Teach back Learning barriers: Cognitive limitations -- dementia; assisted by daughter  Willingness to learn/ readiness for change: Eager, change in progress  Monitoring and Evaluation:  Dietary intake, exercise, and body weight      follow up:  06/14/22

## 2022-10-08 ENCOUNTER — Encounter: Payer: Self-pay | Admitting: Internal Medicine

## 2022-10-08 ENCOUNTER — Ambulatory Visit (INDEPENDENT_AMBULATORY_CARE_PROVIDER_SITE_OTHER): Payer: Medicare HMO | Admitting: Internal Medicine

## 2022-10-08 VITALS — BP 118/78 | HR 76 | Temp 97.9°F | Ht 67.5 in | Wt 102.6 lb

## 2022-10-08 DIAGNOSIS — F419 Anxiety disorder, unspecified: Secondary | ICD-10-CM | POA: Diagnosis not present

## 2022-10-08 DIAGNOSIS — Z8719 Personal history of other diseases of the digestive system: Secondary | ICD-10-CM

## 2022-10-08 DIAGNOSIS — E785 Hyperlipidemia, unspecified: Secondary | ICD-10-CM

## 2022-10-08 DIAGNOSIS — F1320 Sedative, hypnotic or anxiolytic dependence, uncomplicated: Secondary | ICD-10-CM

## 2022-10-08 DIAGNOSIS — R159 Full incontinence of feces: Secondary | ICD-10-CM

## 2022-10-08 DIAGNOSIS — F330 Major depressive disorder, recurrent, mild: Secondary | ICD-10-CM

## 2022-10-08 DIAGNOSIS — K219 Gastro-esophageal reflux disease without esophagitis: Secondary | ICD-10-CM | POA: Diagnosis not present

## 2022-10-08 LAB — COMPREHENSIVE METABOLIC PANEL
ALT: 9 U/L (ref 0–35)
AST: 19 U/L (ref 0–37)
Albumin: 3.8 g/dL (ref 3.5–5.2)
Alkaline Phosphatase: 48 U/L (ref 39–117)
BUN: 22 mg/dL (ref 6–23)
CO2: 31 mEq/L (ref 19–32)
Calcium: 9.2 mg/dL (ref 8.4–10.5)
Chloride: 103 mEq/L (ref 96–112)
Creatinine, Ser: 0.57 mg/dL (ref 0.40–1.20)
GFR: 83.06 mL/min (ref >60.00)
Glucose, Bld: 91 mg/dL (ref 70–99)
Potassium: 4 mEq/L (ref 3.5–5.1)
Sodium: 141 mEq/L (ref 135–145)
Total Bilirubin: 0.5 mg/dL (ref 0.2–1.2)
Total Protein: 6.7 g/dL (ref 6.0–8.3)

## 2022-10-08 LAB — CBC WITH DIFFERENTIAL/PLATELET
Basophils Absolute: 0 10*3/uL (ref 0.0–0.1)
Basophils Relative: 0.2 % (ref 0.0–3.0)
Eosinophils Absolute: 0 10*3/uL (ref 0.0–0.7)
Eosinophils Relative: 0.4 % (ref 0.0–5.0)
HCT: 40.7 % (ref 36.0–46.0)
Hemoglobin: 13.3 g/dL (ref 12.0–15.0)
Lymphocytes Relative: 26.4 % (ref 12.0–46.0)
Lymphs Abs: 1.8 10*3/uL (ref 0.7–4.0)
MCHC: 32.6 g/dL (ref 30.0–36.0)
MCV: 93.1 fl (ref 78.0–100.0)
Monocytes Absolute: 0.7 10*3/uL (ref 0.1–1.0)
Monocytes Relative: 11 % (ref 3.0–12.0)
Neutro Abs: 4.1 10*3/uL (ref 1.4–7.7)
Neutrophils Relative %: 62 % (ref 43.0–77.0)
Platelets: 260 10*3/uL (ref 150.0–400.0)
RBC: 4.38 Mil/uL (ref 3.87–5.11)
RDW: 14.2 % (ref 11.5–15.5)
WBC: 6.7 10*3/uL (ref 4.0–10.5)

## 2022-10-08 LAB — LIPID PANEL
Cholesterol: 219 mg/dL — ABNORMAL HIGH (ref 0–200)
HDL: 56.4 mg/dL (ref >39.00)
LDL Cholesterol: 124 mg/dL — ABNORMAL HIGH (ref 0–99)
NonHDL: 162.1
Total CHOL/HDL Ratio: 4
Triglycerides: 189 mg/dL — ABNORMAL HIGH (ref 0.0–149.0)
VLDL: 37.8 mg/dL (ref 0.0–40.0)

## 2022-10-08 LAB — TSH: TSH: 1.78 u[IU]/mL (ref 0.35–5.50)

## 2022-10-08 NOTE — Progress Notes (Signed)
Subjective:    Patient ID: Dorothy Olson, female    DOB: June 30, 1935, 87 y.o.   MRN: [id number]  Patient here for  Chief Complaint  Patient presents with   Establish Care    HPI Here to establish care.  Has been seeing neurology (Dr Sherryll Burger) and diagnosed with mild cognitive impairment.  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  Recommended continue memantine XR 14mg  q day.  Recommended setting alarms to remind to eat. Observed meal times.  Recommended continuing remeron and referred to a nutritionist. Previous MRI 2023 - age appropriate changes.  Seeing Dr Elna Breslow.  Apparently suffered some depression after husband passed.  This appears to have worsened after covid.  Became more isolated, not wanting to go out. Also has a history of macular degeneration.  Followed by ophthalmology. Has a history of chronic constipation.  Last colonoscopy 2017 - one adenomatous polyp. Xray - large colonic stool burden without evidence of fecal impaction.  Evaluated by GI 05/31/22 - weight loss and fecal incontinence/encopresis. Has a baseline history of chronic constipation. Last colonoscopy reportedly in 2017 in [place] showed one adenomatous colon polyps. Review of records shows in 2007 she had imaging showing partial tear of internal anal sphincter. She had CT chest/abd/pelvis performed 07/2019 to work-up weight loss and generalized weakness which commented on rectal distention with stool suggesting fecal impaction and large colonic stool burden. 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 Xray - increased stool burden.  Celiac panel - negative.  Recommended bowel purge.  Daily fiber supplement.  Taking clear lax q 3 days.  Drinking boost.  Still with bowel issues and leakage of stool.  Wears depends.  Breathing stable.  No increased cough or  congestion.  No abdominal pain.  Lives on daughter's property - in trailer.     Past Medical History:  Diagnosis Date   Anxiety    Dementia (HCC)    Depression    Hyperlipidemia    Osteoarthritis    Past Surgical History:  Procedure Laterality Date   MINOR HEMORRHOIDECTOMY     TUBAL LIGATION     Family History  Problem Relation Age of Onset   Diabetes Mother    Stroke Father    Mental illness Neg Hx    Social History   Socioeconomic History   Marital status: Widowed    Spouse name: Not on file   Number of children: 2   Years of education: Not on file   Highest education level: Not on file  Occupational History   Not on file  Tobacco Use   Smoking status: Never   Smokeless tobacco: Never  Vaping Use   Vaping Use: Former  Substance and Sexual Activity   Alcohol use: Not Currently   Drug use: Not Currently   Sexual activity: Not Currently  Other Topics Concern   Not on file  Social History Narrative   Not on file   Social Determinants of Health   Financial Resource Strain: Low Risk  (12/17/2018)   Overall Financial Resource Strain (CARDIA)    Difficulty of Paying Living Expenses: Not hard at all  Food Insecurity: No Food Insecurity (12/17/2018)   Hunger Vital Sign    Worried About Running Out of Food in the Last Year: Never true    Ran Out of Food in the Last Year: Never true  Transportation  Needs: No Transportation Needs (12/17/2018)   PRAPARE - Administrator, Civil Service (Medical): No    Lack of Transportation (Non-Medical): No  Physical Activity: Inactive (12/17/2018)   Exercise Vital Sign    Days of Exercise per Week: 0 days    Minutes of Exercise per Session: 0 min  Stress: Stress Concern Present (12/17/2018)   [name]    Feeling of Stress : To some extent  Social Connections: Unknown (12/17/2018)   Social Connection and Isolation Panel [NHANES]    Frequency of  Communication with Friends and Family: Not on file    Frequency of Social Gatherings with Friends and Family: Not on file    Attends Religious Services: More than 4 times per year    Active Member of [employer] or Organizations: No    Attends Banker Meetings: Never    Marital Status: Widowed     Review of Systems  Constitutional:  Negative for fever.       Weight loss as outlined.  In reviewing, she is up 6 pounds from previous check.   HENT:  Negative for congestion and sinus pressure.   Respiratory:  Negative for cough, chest tightness and shortness of breath.   Cardiovascular:  Negative for chest pain, palpitations and leg swelling.  Gastrointestinal:  Negative for abdominal pain, nausea and vomiting.  Genitourinary:  Negative for difficulty urinating and dysuria.  Musculoskeletal:  Negative for joint swelling and myalgias.  Skin:  Negative for color change and rash.  Neurological:  Negative for dizziness and headaches.  Psychiatric/Behavioral:  Negative for agitation.        History of depression as outlined.        Objective:     BP 118/78   Pulse 76   Temp 97.9 F (36.6 C) (Oral)   Ht 5' 7.5" (1.715 m)   Wt 102 lb 9.6 oz (46.5 kg)   LMP  (LMP Unknown)   SpO2 97%   BMI 15.83 kg/m  Wt Readings from Last 3 Encounters:  10/08/22 102 lb 9.6 oz (46.5 kg)  04/19/22 96 lb 8 oz (43.8 kg)    Physical Exam Vitals reviewed.  Constitutional:      General: She is not in acute distress.    Appearance: Normal appearance.  HENT:     Head: Normocephalic and atraumatic.     Right Ear: External ear normal.     Left Ear: External ear normal.  Eyes:     General: No scleral icterus.       Right eye: No discharge.        Left eye: No discharge.     Conjunctiva/sclera: Conjunctivae normal.  Neck:     Thyroid: No thyromegaly.  Cardiovascular:     Rate and Rhythm: Normal rate and regular rhythm.  Pulmonary:     Effort: No respiratory distress.     Breath sounds:  Normal breath sounds. No wheezing.  Abdominal:     General: Bowel sounds are normal.     Palpations: Abdomen is soft.     Tenderness: There is no abdominal tenderness.  Musculoskeletal:        General: No swelling or tenderness.     Cervical back: Neck supple. No tenderness.  Lymphadenopathy:     Cervical: No cervical adenopathy.  Skin:    Findings: No rash.     Comments: No skin breakdown - peri rectum.   Neurological:  Mental Status: She is alert.  Psychiatric:        Mood and Affect: Mood normal.        Behavior: Behavior normal.      Outpatient Encounter Medications as of 10/08/2022  Medication Sig   megestrol (MEGACE) 40 MG/ML suspension 10 ml   mirtazapine (REMERON) 15 MG tablet Take 1.5 tablets (22.5 mg total) by mouth at bedtime.   hydrOXYzine (ATARAX/VISTARIL) 25 MG tablet TAKE 0.5 TABLETS BY MOUTH AS DIRECTED. ONLY 3-4 TIMES A WEEK AS NEEDED FOR SEVERE ANXIETY ATTACKS (Patient not taking: Reported on 10/08/2022)   [DISCONTINUED] calcium citrate-vitamin D (CITRACAL+D) 315-200 MG-UNIT tablet Take by mouth. (Patient not taking: Reported on 10/08/2022)   [DISCONTINUED] estradiol (ESTRACE VAGINAL) 0.1 MG/GM vaginal cream apply a small amount to vagina at night weekly (Patient not taking: Reported on 10/08/2022)   [DISCONTINUED] ezetimibe-simvastatin (VYTORIN) 10-20 MG tablet Take 1 tablet by mouth daily. (Patient not taking: Reported on 10/08/2022)   [DISCONTINUED] megestrol (MEGACE) 40 MG/ML suspension Take 400 mg by mouth daily. (Patient not taking: Reported on 10/08/2022)   [DISCONTINUED] Multiple Vitamins-Minerals (PRESERVISION AREDS) CAPS  (Patient not taking: Reported on 10/08/2022)   [DISCONTINUED] omeprazole (PRILOSEC) 20 MG capsule 1 capsule (Patient not taking: Reported on 10/08/2022)   [DISCONTINUED] Potassium 99 MG TABS 1 tablet (Patient not taking: Reported on 10/08/2022)   [DISCONTINUED] vitamin E 400 UNIT capsule 1 capsule (Patient not taking: Reported on 10/08/2022)    No facility-administered encounter medications on file as of 10/08/2022.     Lab Results  Component Value Date   WBC 6.7 10/08/2022   HGB 13.3 10/08/2022   HCT 40.7 10/08/2022   PLT 260.0 10/08/2022   GLUCOSE 91 10/08/2022   CHOL 219 (H) 10/08/2022   TRIG 189.0 (H) 10/08/2022   HDL 56.40 10/08/2022   LDLCALC 124 (H) 10/08/2022   ALT 9 10/08/2022   AST 19 10/08/2022   NA 141 10/08/2022   K 4.0 10/08/2022   CL 103 10/08/2022   CREATININE 0.57 10/08/2022   BUN 22 10/08/2022   CO2 31 10/08/2022   TSH 1.78 10/08/2022    MR BRAIN W WO CONTRAST  Result Date: 07/18/2021 CLINICAL DATA:  Progressive memory loss over several years EXAM: MRI HEAD WITHOUT AND WITH CONTRAST TECHNIQUE: Multiplanar, multiecho pulse sequences of the brain and surrounding structures were obtained without and with intravenous contrast. CONTRAST:  5mL GADAVIST GADOBUTROL 1 MMOL/ML IV SOLN COMPARISON:  None. FINDINGS: Brain: There is no evidence of acute intracranial hemorrhage, extra-axial fluid collection, or acute infarct. There is mild global parenchymal volume loss with prominence of the ventricular system and extra-axial CSF spaces, within expected limits for age. There is no discernible lobar predominance. There is no disproportionate hippocampal atrophy. There is minimal FLAIR signal abnormality in the subcortical and periventricular white matter likely reflecting sequela of minimal chronic white matter microangiopathy. There is no suspicious parenchymal signal abnormality. There is no mass lesion. There is no abnormal enhancement. There is no mass effect or midline shift. Vascular: Normal flow voids. Skull and upper cervical spine: Normal marrow signal. Sinuses/Orbits: There is mild mucosal thickening in the right frontal sinus. Bilateral lens implants are in place. The globes and orbits are otherwise unremarkable. Other: None. IMPRESSION: Essentially normal for age brain MRI. Mild global parenchymal volume loss  without discernible lobar predominance or disproportionate hippocampal atrophy, and minimal chronic white matter microangiopathy. Electronically Signed   By: Lesia Hausen M.D.   On: 07/18/2021 11:52  Assessment & Plan:  Hyperlipidemia, unspecified hyperlipidemia type Assessment & Plan: Recheck lipid panel today.   Orders: -     CBC with Differential/Platelet -     Comprehensive metabolic panel -     TSH -     Lipid panel  Anxiety disorder, unspecified type Assessment & Plan: Seeing Dr Elna Breslow.  No longer on xanax.     Benzodiazepine dependence (HCC) Assessment & Plan: Off xanax.  Followed by psychiatry.    Gastroesophageal reflux disease, unspecified whether esophagitis present Assessment & Plan: No upper symptoms reported.  Off prilosec.    MDD (major depressive disorder), recurrent episode, mild (HCC) Assessment & Plan: Continues on remeron.  Continue follow up with Dr Elna Breslow. Has good family support.    History of constipation Assessment & Plan: Continues clear lax.  GI w/up as outlined.     Encopresis Assessment & Plan: Described issues with bowel leakage as outlined.  GI w/up as outlined.  Continues clear lax q 3 days.        Dale [place], MD

## 2022-10-13 ENCOUNTER — Encounter: Payer: Self-pay | Admitting: Internal Medicine

## 2022-10-13 DIAGNOSIS — Z8719 Personal history of other diseases of the digestive system: Secondary | ICD-10-CM | POA: Insufficient documentation

## 2022-10-13 DIAGNOSIS — R159 Full incontinence of feces: Secondary | ICD-10-CM | POA: Insufficient documentation

## 2022-10-13 NOTE — Assessment & Plan Note (Signed)
Recheck lipid panel today. 

## 2022-10-13 NOTE — Assessment & Plan Note (Signed)
Off xanax.  Followed by psychiatry.

## 2022-10-13 NOTE — Assessment & Plan Note (Signed)
Seeing Dr Elna Breslow.  No longer on xanax.

## 2022-10-13 NOTE — Assessment & Plan Note (Signed)
No upper symptoms reported.  Off prilosec.

## 2022-10-13 NOTE — Assessment & Plan Note (Addendum)
Continues on remeron.  Continue follow up with Dr Elna Breslow. Has good family support.

## 2022-10-13 NOTE — Assessment & Plan Note (Signed)
Described issues with bowel leakage as outlined.  GI w/up as outlined.  Continues clear lax q 3 days.

## 2022-10-13 NOTE — Assessment & Plan Note (Signed)
Continues clear lax.  GI w/up as outlined.

## 2022-12-13 ENCOUNTER — Encounter (INDEPENDENT_AMBULATORY_CARE_PROVIDER_SITE_OTHER): Payer: Self-pay

## 2022-12-24 ENCOUNTER — Telehealth: Payer: Self-pay | Admitting: Internal Medicine

## 2022-12-24 NOTE — Telephone Encounter (Signed)
Copied from CRM [id number]. Topic: Medicare AWV >> Dec 24, 2022  1:18 PM Payton Doughty wrote: Reason for CRM: LM 12/24/2022 to schedule AWV   Verlee Rossetti; Care Guide Ambulatory Clinical Support [facility] Direct Dial: [phone]

## 2023-01-10 ENCOUNTER — Encounter: Payer: Medicare HMO | Admitting: Internal Medicine

## 2023-01-11 ENCOUNTER — Encounter: Payer: Medicare HMO | Admitting: Internal Medicine

## 2023-01-17 ENCOUNTER — Encounter: Payer: Self-pay | Admitting: Internal Medicine

## 2023-01-17 ENCOUNTER — Other Ambulatory Visit: Payer: Self-pay

## 2023-01-17 ENCOUNTER — Ambulatory Visit (INDEPENDENT_AMBULATORY_CARE_PROVIDER_SITE_OTHER): Payer: Medicare HMO | Admitting: Internal Medicine

## 2023-01-17 VITALS — BP 108/68 | HR 74 | Temp 98.0°F | Ht 67.0 in | Wt 105.4 lb

## 2023-01-17 DIAGNOSIS — F320 Major depressive disorder, single episode, mild: Secondary | ICD-10-CM

## 2023-01-17 DIAGNOSIS — F330 Major depressive disorder, recurrent, mild: Secondary | ICD-10-CM

## 2023-01-17 DIAGNOSIS — Z0000 Encounter for general adult medical examination without abnormal findings: Secondary | ICD-10-CM | POA: Insufficient documentation

## 2023-01-17 DIAGNOSIS — F419 Anxiety disorder, unspecified: Secondary | ICD-10-CM | POA: Diagnosis not present

## 2023-01-17 DIAGNOSIS — Z8719 Personal history of other diseases of the digestive system: Secondary | ICD-10-CM | POA: Diagnosis not present

## 2023-01-17 DIAGNOSIS — E785 Hyperlipidemia, unspecified: Secondary | ICD-10-CM

## 2023-01-17 MED ORDER — MEMANTINE HCL ER 14 MG PO CP24
14.0000 mg | ORAL_CAPSULE | Freq: Every day | ORAL | 3 refills | Status: DC
  Filled 2023-01-17 – 2023-07-22 (×3): qty 90, 90d supply, fill #0
  Filled 2023-10-12: qty 90, 90d supply, fill #1
  Filled 2024-01-09: qty 90, 90d supply, fill #2

## 2023-01-17 MED ORDER — MIRTAZAPINE 15 MG PO TABS
22.5000 mg | ORAL_TABLET | Freq: Every day | ORAL | 2 refills | Status: DC
Start: 2023-01-17 — End: 2023-08-22
  Filled 2023-01-17 – 2023-08-05 (×2): qty 45, 30d supply, fill #0
  Filled 2023-08-18: qty 45, 30d supply, fill #1
  Filled ????-??-??: fill #1

## 2023-01-17 NOTE — Progress Notes (Signed)
Subjective:    Patient ID: Dorothy Olson, female    DOB: 12/25/1935, 87 y.o.   MRN: [id number]  Patient here for  Chief Complaint  Patient presents with   Annual Exam    HPI Here for a physical exam. She is accompanied by her daughter.  History obtained from both of them.  Reports she is eating well.  Weight is up a few more pounds.  Doing well with ADLs.  Breathing stable.  Has her regular bowel regimen.  Discussed importance of keeping bowels moving.  No vomiting.  Right hip issues.  Overall getting around ok. Notify if desires further intervention.   Past Medical History:  Diagnosis Date   Anxiety    Dementia (HCC)    Depression    Hyperlipidemia    Osteoarthritis    Past Surgical History:  Procedure Laterality Date   MINOR HEMORRHOIDECTOMY     TUBAL LIGATION     Family History  Problem Relation Age of Onset   Diabetes Mother    Stroke Father    Mental illness Neg Hx    Social History   Socioeconomic History   Marital status: Widowed    Spouse name: Not on file   Number of children: 2   Years of education: Not on file   Highest education level: 12th grade  Occupational History   Not on file  Tobacco Use   Smoking status: Never   Smokeless tobacco: Never  Vaping Use   Vaping status: Former  Substance and Sexual Activity   Alcohol use: Not Currently   Drug use: Not Currently   Sexual activity: Not Currently  Other Topics Concern   Not on file  Social History Narrative   Not on file   Social Determinants of Health   Financial Resource Strain: Low Risk  (01/15/2023)   Overall Financial Resource Strain (CARDIA)    Difficulty of Paying Living Expenses: Not very hard  Food Insecurity: No Food Insecurity (01/15/2023)   Hunger Vital Sign    Worried About Running Out of Food in the Last Year: Never true    Ran Out of Food in the Last Year: Never true  Transportation Needs: No Transportation Needs (01/15/2023)   PRAPARE - Scientist, research (physical sciences) (Medical): No    Lack of Transportation (Non-Medical): No  Physical Activity: Unknown (01/15/2023)   Exercise Vital Sign    Days of Exercise per Week: 0 days    Minutes of Exercise per Session: Not on file  Stress: No Stress Concern Present (01/15/2023)   [name]    Feeling of Stress : Not at all  Social Connections: Moderately Isolated (01/15/2023)   Social Connection and Isolation Panel [NHANES]    Frequency of Communication with Friends and Family: Twice a week    Frequency of Social Gatherings with Friends and Family: More than three times a week    Attends Religious Services: 1 to 4 times per year    Active Member of [employer] or Organizations: No    Attends Banker Meetings: Not on file    Marital Status: Widowed     Review of Systems  Constitutional:  Negative for appetite change and unexpected weight change.  HENT:  Negative for congestion, sinus pressure and sore throat.   Eyes:  Negative for pain and visual disturbance.  Respiratory:  Negative for cough, chest tightness and shortness of breath.   Cardiovascular:  Negative for  chest pain and palpitations.  Gastrointestinal:  Negative for abdominal pain, nausea and vomiting.  Genitourinary:  Negative for difficulty urinating and dysuria.  Musculoskeletal:  Negative for joint swelling and myalgias.  Skin:  Negative for color change and rash.  Neurological:  Negative for dizziness and headaches.  Hematological:  Negative for adenopathy. Does not bruise/bleed easily.  Psychiatric/Behavioral:  Negative for agitation.        Objective:     BP 108/68   Pulse 74   Temp 98 F (36.7 C) (Oral)   Ht 5\' 7"  (1.702 m)   Wt 105 lb 6.4 oz (47.8 kg)   SpO2 100%   BMI 16.51 kg/m  Wt Readings from Last 3 Encounters:  01/17/23 105 lb 6.4 oz (47.8 kg)  10/08/22 102 lb 9.6 oz (46.5 kg)  04/19/22 96 lb 8 oz (43.8 kg)    Physical  Exam Vitals reviewed.  Constitutional:      General: She is not in acute distress.    Appearance: Normal appearance. She is well-developed.  HENT:     Head: Normocephalic and atraumatic.     Right Ear: External ear normal.     Left Ear: External ear normal.  Eyes:     General: No scleral icterus.       Right eye: No discharge.        Left eye: No discharge.     Conjunctiva/sclera: Conjunctivae normal.  Neck:     Thyroid: No thyromegaly.  Cardiovascular:     Rate and Rhythm: Normal rate and regular rhythm.  Pulmonary:     Effort: No tachypnea, accessory muscle usage or respiratory distress.     Breath sounds: Normal breath sounds. No decreased breath sounds or wheezing.  Chest:  Breasts:    Right: No inverted nipple, mass, nipple discharge or tenderness (no axillary adenopathy).     Left: No inverted nipple, mass, nipple discharge or tenderness (no axilarry adenopathy).  Abdominal:     General: Bowel sounds are normal.     Palpations: Abdomen is soft.     Tenderness: There is no abdominal tenderness.  Musculoskeletal:        General: No swelling or tenderness.     Cervical back: Neck supple.  Lymphadenopathy:     Cervical: No cervical adenopathy.  Skin:    Findings: No erythema or rash.  Neurological:     Mental Status: She is alert and oriented to person, place, and time.  Psychiatric:        Mood and Affect: Mood normal.        Behavior: Behavior normal.      Outpatient Encounter Medications as of 01/17/2023  Medication Sig   memantine (NAMENDA XR) 14 MG CP24 24 hr capsule Take 1 capsule (14 mg total) by mouth daily.   Multiple Vitamins-Minerals (PRESERVISION AREDS) TABS Take 1 tablet by mouth daily.   VITAMIN E PO Take by mouth.   mirtazapine (REMERON) 15 MG tablet Take 1.5 tablets (22.5 mg total) by mouth at bedtime.   [DISCONTINUED] megestrol (MEGACE) 40 MG/ML suspension 10 ml   [DISCONTINUED] mirtazapine (REMERON) 15 MG tablet Take 1.5 tablets (22.5 mg total)  by mouth at bedtime.   No facility-administered encounter medications on file as of 01/17/2023.     Lab Results  Component Value Date   WBC 6.7 10/08/2022   HGB 13.3 10/08/2022   HCT 40.7 10/08/2022   PLT 260.0 10/08/2022   GLUCOSE 91 10/08/2022   CHOL 219 (H) 10/08/2022  TRIG 189.0 (H) 10/08/2022   HDL 56.40 10/08/2022   LDLCALC 124 (H) 10/08/2022   ALT 9 10/08/2022   AST 19 10/08/2022   NA 141 10/08/2022   K 4.0 10/08/2022   CL 103 10/08/2022   CREATININE 0.57 10/08/2022   BUN 22 10/08/2022   CO2 31 10/08/2022   TSH 1.78 10/08/2022    MR BRAIN W WO CONTRAST  Result Date: 07/18/2021 CLINICAL DATA:  Progressive memory loss over several years EXAM: MRI HEAD WITHOUT AND WITH CONTRAST TECHNIQUE: Multiplanar, multiecho pulse sequences of the brain and surrounding structures were obtained without and with intravenous contrast. CONTRAST:  5mL GADAVIST GADOBUTROL 1 MMOL/ML IV SOLN COMPARISON:  None. FINDINGS: Brain: There is no evidence of acute intracranial hemorrhage, extra-axial fluid collection, or acute infarct. There is mild global parenchymal volume loss with prominence of the ventricular system and extra-axial CSF spaces, within expected limits for age. There is no discernible lobar predominance. There is no disproportionate hippocampal atrophy. There is minimal FLAIR signal abnormality in the subcortical and periventricular white matter likely reflecting sequela of minimal chronic white matter microangiopathy. There is no suspicious parenchymal signal abnormality. There is no mass lesion. There is no abnormal enhancement. There is no mass effect or midline shift. Vascular: Normal flow voids. Skull and upper cervical spine: Normal marrow signal. Sinuses/Orbits: There is mild mucosal thickening in the right frontal sinus. Bilateral lens implants are in place. The globes and orbits are otherwise unremarkable. Other: None. IMPRESSION: Essentially normal for age brain MRI. Mild global  parenchymal volume loss without discernible lobar predominance or disproportionate hippocampal atrophy, and minimal chronic white matter microangiopathy. Electronically Signed   By: Lesia Hausen M.D.   On: 07/18/2021 11:52       Assessment & Plan:  Routine general medical examination at a health care facility  Healthcare maintenance Assessment & Plan: Physical today 01/17/23.  Declines mammogram.    MDD (major depressive disorder), single episode, mild (HCC) -     Mirtazapine; Take 1.5 tablets (22.5 mg total) by mouth at bedtime.  Dispense: 45 tablet; Refill: 2  Anxiety disorder, unspecified type Assessment & Plan: Has been followed by Dr Elna Breslow.    Orders: -     Mirtazapine; Take 1.5 tablets (22.5 mg total) by mouth at bedtime.  Dispense: 45 tablet; Refill: 2  History of constipation Assessment & Plan: Continues clear lax.  GI w/up as outlined previously.  Discussed bowel regimen.    Hyperlipidemia, unspecified hyperlipidemia type Assessment & Plan: Follow lipid panel.    MDD (major depressive disorder), recurrent episode, mild (HCC) Assessment & Plan: Continues on remeron.  Continue follow up with Dr Elna Breslow. Has good family support.    Other orders -     Memantine HCl ER; Take 1 capsule (14 mg total) by mouth daily.  Dispense: 90 capsule; Refill: 3     Dale [place], MD

## 2023-01-18 ENCOUNTER — Other Ambulatory Visit: Payer: Self-pay

## 2023-01-19 ENCOUNTER — Encounter: Payer: Self-pay | Admitting: Internal Medicine

## 2023-01-19 NOTE — Assessment & Plan Note (Signed)
Has been followed by Dr Elna Breslow.

## 2023-01-19 NOTE — Assessment & Plan Note (Signed)
Continues clear lax.  GI w/up as outlined previously.  Discussed bowel regimen.

## 2023-01-19 NOTE — Assessment & Plan Note (Signed)
Continues on remeron.  Continue follow up with Dr Elna Breslow. Has good family support.

## 2023-01-19 NOTE — Assessment & Plan Note (Signed)
Physical today 01/17/23.  Declines mammogram.

## 2023-01-19 NOTE — Assessment & Plan Note (Signed)
Follow lipid panel.

## 2023-01-25 ENCOUNTER — Other Ambulatory Visit: Payer: Self-pay

## 2023-02-06 ENCOUNTER — Ambulatory Visit: Payer: Medicare HMO

## 2023-02-06 VITALS — Ht 67.0 in | Wt 105.0 lb

## 2023-02-06 DIAGNOSIS — Z0000 Encounter for general adult medical examination without abnormal findings: Secondary | ICD-10-CM | POA: Diagnosis not present

## 2023-02-06 NOTE — Patient Instructions (Signed)
Dorothy Olson , Thank you for taking time to come for your Medicare Wellness Visit. I appreciate your ongoing commitment to your health goals. Please review the following plan we discussed and let me know if I can assist you in the future.   Referrals/Orders/Follow-Ups/Clinician Recommendations: Remember to update your vaccines  This is a list of the screening recommended for you and due dates:  Health Maintenance  Topic Date Due   DTaP/Tdap/Td vaccine (1 - Tdap) Never done   Zoster (Shingles) Vaccine (1 of 2) Never done   DEXA scan (bone density measurement)  Never done   Pneumonia Vaccine (2 of 2 - PPSV23 or PCV20) 05/04/2014   COVID-19 Vaccine (1 - 2023-24 season) Never done   Flu Shot  07/29/2023*   Medicare Annual Wellness Visit  02/06/2024   HPV Vaccine  Aged Out  *Topic was postponed. The date shown is not the original due date.    Advanced directives: (Copy Requested) Please bring a copy of your health care power of attorney and living will to the office to be added to your chart at your convenience.  Next Medicare Annual Wellness Visit scheduled for next year: Yes 02/11/24 @ 3:45

## 2023-02-06 NOTE — Progress Notes (Signed)
Subjective:   Dorothy Olson is a 87 y.o. female who presents for Medicare Annual (Subsequent) preventive examination.  Visit Complete: Virtual I connected with  Baron Sane on 02/06/23 by a audio enabled telemedicine application and verified that I am speaking with the correct person using two identifiers.  Patient Location: Home  Provider Location: Office/Clinic  I discussed the limitations of evaluation and management by telemedicine. The patient expressed understanding and agreed to proceed.  Vital Signs: Because this visit was a virtual/telehealth visit, some criteria may be missing or patient reported. Any vitals not documented were not able to be obtained and vitals that have been documented are patient reported.  Patient Medicare AWV questionnaire was completed by the patient on 02/05/23; I have confirmed that all information answered by patient is correct and no changes since this date.  Cardiac Risk Factors include: advanced age (>94men, >9 women);dyslipidemia     Objective:    Today's Vitals   02/06/23 1552  Weight: 105 lb (47.6 kg)  Height: 5\' 7"  (1.702 m)   Body mass index is 16.45 kg/m.     02/06/2023    4:07 PM  Advanced Directives  Does Patient Have a Medical Advance Directive? Yes  Type of Estate agent of [place];Living will  Copy of Healthcare Power of Attorney in Chart? No - copy requested    Current Medications (verified) Outpatient Encounter Medications as of 02/06/2023  Medication Sig   memantine (NAMENDA XR) 14 MG CP24 24 hr capsule Take 1 capsule (14 mg total) by mouth daily.   mirtazapine (REMERON) 15 MG tablet Take 1.5 tablets (22.5 mg total) by mouth at bedtime.   Multiple Vitamins-Minerals (PRESERVISION AREDS) TABS Take 1 tablet by mouth daily.   VITAMIN E PO Take by mouth.   No facility-administered encounter medications on file as of 02/06/2023.    Allergies (verified) Amoxicillin-pot clavulanate, Citalopram  hydrobromide, Escitalopram oxalate, Pristiq  [desvenlafaxine succinate er], Amoxicillin, Bupropion, Citalopram, and Sulfa antibiotics   History: Past Medical History:  Diagnosis Date   Anxiety    Dementia (HCC)    Depression    Hyperlipidemia    Osteoarthritis    Past Surgical History:  Procedure Laterality Date   MINOR HEMORRHOIDECTOMY     TUBAL LIGATION     Family History  Problem Relation Age of Onset   Diabetes Mother    Stroke Father    Mental illness Neg Hx    Social History   Socioeconomic History   Marital status: Widowed    Spouse name: Not on file   Number of children: 2   Years of education: Not on file   Highest education level: 12th grade  Occupational History   Not on file  Tobacco Use   Smoking status: Never   Smokeless tobacco: Never  Vaping Use   Vaping status: Former  Substance and Sexual Activity   Alcohol use: Not Currently   Drug use: Not Currently   Sexual activity: Not Currently  Other Topics Concern   Not on file  Social History Narrative   widow   Social Determinants of Health   Financial Resource Strain: Low Risk  (02/05/2023)   Overall Financial Resource Strain (CARDIA)    Difficulty of Paying Living Expenses: Not hard at all  Food Insecurity: No Food Insecurity (02/05/2023)   Hunger Vital Sign    Worried About Running Out of Food in the Last Year: Never true    Ran Out of Food in the Last Year: Never  true  Transportation Needs: No Transportation Needs (02/06/2023)   PRAPARE - Administrator, Civil Service (Medical): No    Lack of Transportation (Non-Medical): No  Physical Activity: Inactive (02/05/2023)   Exercise Vital Sign    Days of Exercise per Week: 0 days    Minutes of Exercise per Session: 0 min  Stress: No Stress Concern Present (02/05/2023)   [name]    Feeling of Stress : Only a little  Social Connections: Socially Isolated (02/05/2023)    Social Connection and Isolation Panel [NHANES]    Frequency of Communication with Friends and Family: Twice a week    Frequency of Social Gatherings with Friends and Family: More than three times a week    Attends Religious Services: Never    Database administrator or Organizations: No    Attends Banker Meetings: Never    Marital Status: Widowed    Tobacco Counseling Counseling given: Not Answered   Clinical Intake:  Pre-visit preparation completed: Yes  Pain : No/denies pain     BMI - recorded: 16.45 Nutritional Status: BMI <19  Underweight Nutritional Risks: None Diabetes: No  How often do you need to have someone help you when you read instructions, pamphlets, or other written materials from your doctor or pharmacy?: 1 - Never  Interpreter Needed?: No  Information entered by :: R. Affie Gasner LPN   Activities of Daily Living    02/05/2023   12:17 PM 12/30/2022    8:50 AM  In your present state of health, do you have any difficulty performing the following activities:  Hearing? 0 0  Vision? 0 0  Comment glasses   Difficulty concentrating or making decisions? 1 1  Walking or climbing stairs? 0 0  Dressing or bathing? 1 1  Doing errands, shopping? 1 1  Preparing Food and eating ? Y Y  Using the Toilet? Y Y  In the past six months, have you accidently leaked urine? Y Y  Do you have problems with loss of bowel control? Y Y  Managing your Medications? Y Y  Managing your Finances? Malvin Johns  Housekeeping or managing your Housekeeping? Malvin Johns    Patient Care Team: Dale Colton, MD as PCP - General (Internal Medicine)  Indicate any recent Medical Services you may have received from other than [facility] providers in the past year (date may be approximate).     Assessment:   This is a routine wellness examination for [place].  Hearing/Vision screen Hearing Screening - Comments:: No  issues Vision Screening - Comments:: glasses   Goals Addressed              This Visit's Progress    Patient Stated       Wants to eat more       Depression Screen    02/06/2023    3:58 PM 01/17/2023    2:34 PM 10/08/2022   11:35 AM  PHQ 2/9 Scores  PHQ - 2 Score 1 1 0  PHQ- 9 Score 4 6 0    Fall Risk    02/05/2023   12:17 PM 01/17/2023    2:34 PM 12/30/2022    8:50 AM 10/08/2022   11:35 AM  Fall Risk   Falls in the past year? 0 0 0 0  Number falls in past yr: 0 0  0  Injury with Fall? 0 0  0  Risk for fall due to : No  Fall Risks No Fall Risks;History of fall(s)  No Fall Risks  Follow up Falls prevention discussed;Falls evaluation completed   Falls evaluation completed    MEDICARE RISK AT HOME: Medicare Risk at Home Any stairs in or around the home?: Yes If so, are there any without handrails?: No Home free of loose throw rugs in walkways, pet beds, electrical cords, etc?: Yes Adequate lighting in your home to reduce risk of falls?: Yes Life alert?: Yes Use of a cane, walker or w/c?: No Grab bars in the bathroom?: No Shower chair or bench in shower?: No Elevated toilet seat or a handicapped toilet?: No     Cognitive Function:        02/06/2023    4:07 PM  6CIT Screen  What Year? 0 points  What month? 0 points  What time? 0 points  Count back from 20 0 points  Months in reverse 0 points  Repeat phrase 10 points  Total Score 10 points    Immunizations Immunization History  Administered Date(s) Administered   Influenza Split 06/05/2012, 01/28/2013   Influenza, High Dose Seasonal PF 02/04/2014, 01/30/2016, 02/28/2017, 01/29/2018, 02/10/2019, 02/09/2020   Influenza,inj,Quad PF,6+ Mos 02/03/2018   Pneumococcal Conjugate-13 05/04/2013    TDAP status: Due, Education has been provided regarding the importance of this vaccine. Advised may receive this vaccine at local pharmacy or Health Dept. Aware to provide a copy of the vaccination record if obtained from local pharmacy or Health Dept. Verbalized acceptance and understanding.  Flu  Vaccine status: Due, Education has been provided regarding the importance of this vaccine. Advised may receive this vaccine at local pharmacy or Health Dept. Aware to provide a copy of the vaccination record if obtained from local pharmacy or Health Dept. Verbalized acceptance and understanding.  Pneumococcal vaccine status: Due, Education has been provided regarding the importance of this vaccine. Advised may receive this vaccine at local pharmacy or Health Dept. Aware to provide a copy of the vaccination record if obtained from local pharmacy or Health Dept. Verbalized acceptance and understanding.  Covid-19 vaccine status: Information provided on how to obtain vaccines.   Qualifies for Shingles Vaccine? Yes   Zostavax completed No   Shingrix Completed?: No.    Education has been provided regarding the importance of this vaccine. Patient has been advised to call insurance company to determine out of pocket expense if they have not yet received this vaccine. Advised may also receive vaccine at local pharmacy or Health Dept. Verbalized acceptance and understanding.  Screening Tests Health Maintenance  Topic Date Due   Medicare Annual Wellness (AWV)  Never done   DTaP/Tdap/Td (1 - Tdap) Never done   Zoster Vaccines- Shingrix (1 of 2) Never done   DEXA SCAN  Never done   Pneumonia Vaccine 82+ Years old (2 of 2 - PPSV23 or PCV20) 05/04/2014   COVID-19 Vaccine (1 - 2023-24 season) Never done   INFLUENZA VACCINE  07/29/2023 (Originally 11/29/2022)   HPV VACCINES  Aged Out    Health Maintenance  Health Maintenance Due  Topic Date Due   Medicare Annual Wellness (AWV)  Never done   DTaP/Tdap/Td (1 - Tdap) Never done   Zoster Vaccines- Shingrix (1 of 2) Never done   DEXA SCAN  Never done   Pneumonia Vaccine 78+ Years old (2 of 2 - PPSV23 or PCV20) 05/04/2014   COVID-19 Vaccine (1 - 2023-24 season) Never done    Colorectal cancer screening: No longer required.   Mammogram Status No longer  required due to age. Patient prefers not to have  Bone Density Status patient declines   Lung Cancer Screening: (Low Dose CT Chest recommended if Age 13-80 years, 20 pack-year currently smoking OR have quit w/in 15years.) does not qualify.     Additional Screening:  Hepatitis C Screening: does not qualify; Completed NA age  Vision Screening: Recommended annual ophthalmology exams for early detection of glaucoma and other disorders of the eye. Is the patient up to date with their annual eye exam?  Yes  Who is the provider or what is the name of the office in which the patient attends annual eye exams? [facility] If pt is not established with a provider, would they like to be referred to a provider to establish care? No .   Dental Screening: Recommended annual dental exams for proper oral hygiene    Community Resource Referral / Chronic Care Management: CRR required this visit?  No   CCM required this visit?  No     Plan:     I have personally reviewed and noted the following in the patient's chart:   Medical and social history Use of alcohol, tobacco or illicit drugs  Current medications and supplements including opioid prescriptions. Patient is not currently taking opioid prescriptions. Functional ability and status Nutritional status Physical activity Advanced directives List of other physicians Hospitalizations, surgeries, and ER visits in previous 12 months Vitals Screenings to include cognitive, depression, and falls Referrals and appointments  In addition, I have reviewed and discussed with patient certain preventive protocols, quality metrics, and best practice recommendations. A written personalized care plan for preventive services as well as general preventive health recommendations were provided to patient.     Sydell Axon, LPN   [id number]/05/[id number]   After Visit Summary: (MyChart) Due to this being a telephonic visit, the after visit summary with patients  personalized plan was offered to patient via MyChart   Nurse Notes: Patient's daughter Judeth Cornfield was present and helped with the visit.

## 2023-05-20 ENCOUNTER — Encounter: Payer: Self-pay | Admitting: Internal Medicine

## 2023-05-20 ENCOUNTER — Ambulatory Visit (INDEPENDENT_AMBULATORY_CARE_PROVIDER_SITE_OTHER): Payer: Medicare PPO | Admitting: Internal Medicine

## 2023-05-20 VITALS — BP 128/70 | HR 77 | Temp 97.9°F | Resp 16 | Ht 67.0 in | Wt 113.6 lb

## 2023-05-20 DIAGNOSIS — R220 Localized swelling, mass and lump, head: Secondary | ICD-10-CM

## 2023-05-20 DIAGNOSIS — F330 Major depressive disorder, recurrent, mild: Secondary | ICD-10-CM | POA: Diagnosis not present

## 2023-05-20 DIAGNOSIS — E785 Hyperlipidemia, unspecified: Secondary | ICD-10-CM | POA: Diagnosis not present

## 2023-05-20 DIAGNOSIS — F419 Anxiety disorder, unspecified: Secondary | ICD-10-CM | POA: Diagnosis not present

## 2023-05-20 NOTE — Progress Notes (Unsigned)
Subjective:    Patient ID: Dorothy Olson, female    DOB: 31-Aug-1935, 88 y.o.   MRN: [id number]  Patient here for  Chief Complaint  Patient presents with  . Medical Management of Chronic Issues    HPI Here for a scheduled follow up - f/u regarding depression and hypercholesterolemia. Sees Dr Elna Breslow. On remeron. Has seen Dr Sherryll Burger - mild cognitive impairment. Continues on memantine XR 14mg  q day.    Past Medical History:  Diagnosis Date  . Anxiety   . Dementia (HCC)   . Depression   . Hyperlipidemia   . Osteoarthritis    Past Surgical History:  Procedure Laterality Date  . MINOR HEMORRHOIDECTOMY    . TUBAL LIGATION     Family History  Problem Relation Age of Onset  . Diabetes Mother   . Stroke Father   . Mental illness Neg Hx    Social History   Socioeconomic History  . Marital status: Widowed    Spouse name: Not on file  . Number of children: 2  . Years of education: Not on file  . Highest education level: 12th grade  Occupational History  . Not on file  Tobacco Use  . Smoking status: Never  . Smokeless tobacco: Never  Vaping Use  . Vaping status: Former  Substance and Sexual Activity  . Alcohol use: Not Currently  . Drug use: Not Currently  . Sexual activity: Not Currently  Other Topics Concern  . Not on file  Social History Narrative   widow   Social Drivers of Health   Financial Resource Strain: Low Risk  (05/16/2023)   Overall Financial Resource Strain (CARDIA)   . Difficulty of Paying Living Expenses: Not very hard  Food Insecurity: No Food Insecurity (05/16/2023)   Hunger Vital Sign   . Worried About Programme researcher, broadcasting/film/video in the Last Year: Never true   . Ran Out of Food in the Last Year: Never true  Transportation Needs: No Transportation Needs (05/16/2023)   PRAPARE - Transportation   . Lack of Transportation (Medical): No   . Lack of Transportation (Non-Medical): No  Physical Activity: Inactive (05/16/2023)   Exercise Vital Sign   . Days of  Exercise per Week: 0 days   . Minutes of Exercise per Session: 0 min  Stress: No Stress Concern Present (05/16/2023)   [name]   . Feeling of Stress : Only a little  Social Connections: Moderately Integrated (05/16/2023)   Social Connection and Isolation Panel [NHANES]   . Frequency of Communication with Friends and Family: Once a week   . Frequency of Social Gatherings with Friends and Family: More than three times a week   . Attends Religious Services: More than 4 times per year   . Active Member of Clubs or Organizations: Yes   . Attends Banker Meetings: More than 4 times per year   . Marital Status: Widowed     Review of Systems     Objective:     BP 128/70   Pulse 77   Temp 97.9 F (36.6 C)   Resp 16   Ht 5\' 7"  (1.702 m)   Wt 113 lb 9.6 oz (51.5 kg)   SpO2 98%   BMI 17.79 kg/m  Wt Readings from Last 3 Encounters:  05/20/23 113 lb 9.6 oz (51.5 kg)  02/06/23 105 lb (47.6 kg)  01/17/23 105 lb 6.4 oz (47.8 kg)    Physical  Exam  {Perform Simple Foot Exam  Perform Detailed exam:1} {Insert foot Exam (Optional):30965}   Outpatient Encounter Medications as of 05/20/2023  Medication Sig  . memantine (NAMENDA XR) 14 MG CP24 24 hr capsule Take 1 capsule (14 mg total) by mouth daily.  . mirtazapine (REMERON) 15 MG tablet Take 1.5 tablets (22.5 mg total) by mouth at bedtime.  . Multiple Vitamins-Minerals (PRESERVISION AREDS) TABS Take 1 tablet by mouth daily.  Marland Kitchen VITAMIN E PO Take by mouth.   No facility-administered encounter medications on file as of 05/20/2023.     Lab Results  Component Value Date   WBC 6.7 10/08/2022   HGB 13.3 10/08/2022   HCT 40.7 10/08/2022   PLT 260.0 10/08/2022   GLUCOSE 91 10/08/2022   CHOL 219 (H) 10/08/2022   TRIG 189.0 (H) 10/08/2022   HDL 56.40 10/08/2022   LDLCALC 124 (H) 10/08/2022   ALT 9 10/08/2022   AST 19 10/08/2022   NA 141 10/08/2022   K 4.0  10/08/2022   CL 103 10/08/2022   CREATININE 0.57 10/08/2022   BUN 22 10/08/2022   CO2 31 10/08/2022   TSH 1.78 10/08/2022    MR BRAIN W WO CONTRAST Result Date: 07/18/2021 CLINICAL DATA:  Progressive memory loss over several years EXAM: MRI HEAD WITHOUT AND WITH CONTRAST TECHNIQUE: Multiplanar, multiecho pulse sequences of the brain and surrounding structures were obtained without and with intravenous contrast. CONTRAST:  5mL GADAVIST GADOBUTROL 1 MMOL/ML IV SOLN COMPARISON:  None. FINDINGS: Brain: There is no evidence of acute intracranial hemorrhage, extra-axial fluid collection, or acute infarct. There is mild global parenchymal volume loss with prominence of the ventricular system and extra-axial CSF spaces, within expected limits for age. There is no discernible lobar predominance. There is no disproportionate hippocampal atrophy. There is minimal FLAIR signal abnormality in the subcortical and periventricular white matter likely reflecting sequela of minimal chronic white matter microangiopathy. There is no suspicious parenchymal signal abnormality. There is no mass lesion. There is no abnormal enhancement. There is no mass effect or midline shift. Vascular: Normal flow voids. Skull and upper cervical spine: Normal marrow signal. Sinuses/Orbits: There is mild mucosal thickening in the right frontal sinus. Bilateral lens implants are in place. The globes and orbits are otherwise unremarkable. Other: None. IMPRESSION: Essentially normal for age brain MRI. Mild global parenchymal volume loss without discernible lobar predominance or disproportionate hippocampal atrophy, and minimal chronic white matter microangiopathy. Electronically Signed   By: Lesia Hausen M.D.   On: 07/18/2021 11:52       Assessment & Plan:  Hyperlipidemia, unspecified hyperlipidemia type     Dale Hancock, MD

## 2023-05-21 ENCOUNTER — Encounter: Payer: Self-pay | Admitting: Internal Medicine

## 2023-05-21 DIAGNOSIS — R220 Localized swelling, mass and lump, head: Secondary | ICD-10-CM | POA: Insufficient documentation

## 2023-05-21 LAB — LIPID PANEL
Cholesterol: 225 mg/dL — ABNORMAL HIGH (ref 0–200)
HDL: 44.5 mg/dL (ref >39.00)
LDL Cholesterol: 140 mg/dL — ABNORMAL HIGH (ref 0–99)
NonHDL: 180.4
Total CHOL/HDL Ratio: 5
Triglycerides: 202 mg/dL — ABNORMAL HIGH (ref 0.0–149.0)
VLDL: 40.4 mg/dL — ABNORMAL HIGH (ref 0.0–40.0)

## 2023-05-21 LAB — COMPREHENSIVE METABOLIC PANEL
ALT: 10 U/L (ref 0–35)
AST: 20 U/L (ref 0–37)
Albumin: 3.9 g/dL (ref 3.5–5.2)
Alkaline Phosphatase: 65 U/L (ref 39–117)
BUN: 21 mg/dL (ref 6–23)
CO2: 31 meq/L (ref 19–32)
Calcium: 8.9 mg/dL (ref 8.4–10.5)
Chloride: 103 meq/L (ref 96–112)
Creatinine, Ser: 0.64 mg/dL (ref 0.40–1.20)
GFR: 80.43 mL/min (ref >60.00)
Glucose, Bld: 101 mg/dL — ABNORMAL HIGH (ref 70–99)
Potassium: 4 meq/L (ref 3.5–5.1)
Sodium: 140 meq/L (ref 135–145)
Total Bilirubin: 0.4 mg/dL (ref 0.2–1.2)
Total Protein: 7.1 g/dL (ref 6.0–8.3)

## 2023-05-21 NOTE — Assessment & Plan Note (Signed)
Follow lipid panel.   

## 2023-05-21 NOTE — Assessment & Plan Note (Signed)
Has been followed by Dr Elna Breslow.  Continue remeron.

## 2023-05-21 NOTE — Assessment & Plan Note (Signed)
Notices after sleeping on her right side. Better when up. Discussed further evaluation. Wants to hold.  Follow.

## 2023-05-21 NOTE — Assessment & Plan Note (Signed)
Continues on remeron.  Continue follow up with Dr Elna Breslow. Has good family support.

## 2023-07-21 ENCOUNTER — Other Ambulatory Visit: Payer: Self-pay

## 2023-07-22 ENCOUNTER — Other Ambulatory Visit: Payer: Self-pay

## 2023-07-23 ENCOUNTER — Other Ambulatory Visit: Payer: Self-pay

## 2023-07-23 DIAGNOSIS — H31103 Choroidal degeneration, unspecified, bilateral: Secondary | ICD-10-CM | POA: Diagnosis not present

## 2023-07-23 DIAGNOSIS — H524 Presbyopia: Secondary | ICD-10-CM | POA: Diagnosis not present

## 2023-07-23 DIAGNOSIS — H16223 Keratoconjunctivitis sicca, not specified as Sjogren's, bilateral: Secondary | ICD-10-CM | POA: Diagnosis not present

## 2023-07-23 DIAGNOSIS — H442D2 Degenerative myopia with foveoschisis, left eye: Secondary | ICD-10-CM | POA: Diagnosis not present

## 2023-07-23 DIAGNOSIS — H442E1 Degenerative myopia with other maculopathy, right eye: Secondary | ICD-10-CM | POA: Diagnosis not present

## 2023-07-23 DIAGNOSIS — Z961 Presence of intraocular lens: Secondary | ICD-10-CM | POA: Diagnosis not present

## 2023-08-06 ENCOUNTER — Other Ambulatory Visit: Payer: Self-pay

## 2023-08-18 ENCOUNTER — Other Ambulatory Visit: Payer: Self-pay

## 2023-08-19 ENCOUNTER — Other Ambulatory Visit: Payer: Self-pay

## 2023-08-21 ENCOUNTER — Other Ambulatory Visit: Payer: Self-pay

## 2023-08-21 ENCOUNTER — Encounter: Payer: Self-pay | Admitting: Internal Medicine

## 2023-08-21 DIAGNOSIS — F419 Anxiety disorder, unspecified: Secondary | ICD-10-CM

## 2023-08-21 DIAGNOSIS — F320 Major depressive disorder, single episode, mild: Secondary | ICD-10-CM

## 2023-08-22 ENCOUNTER — Other Ambulatory Visit: Payer: Self-pay

## 2023-08-22 MED ORDER — MIRTAZAPINE 15 MG PO TABS
45.0000 mg | ORAL_TABLET | Freq: Every day | ORAL | 1 refills | Status: DC
Start: 2023-08-22 — End: 2024-02-12
  Filled 2023-08-22: qty 270, fill #0
  Filled 2023-08-22: qty 270, 90d supply, fill #0
  Filled 2023-11-18: qty 270, 90d supply, fill #1

## 2023-08-22 NOTE — Telephone Encounter (Signed)
 Ok for me to send in the mirtazapine  45 mg for her?

## 2023-08-22 NOTE — Telephone Encounter (Signed)
 Rx sent in for remeron  - take 3 tablets before bed

## 2023-08-23 ENCOUNTER — Other Ambulatory Visit: Payer: Self-pay

## 2023-09-17 ENCOUNTER — Ambulatory Visit: Payer: Medicare PPO | Admitting: Internal Medicine

## 2023-09-17 ENCOUNTER — Encounter: Payer: Self-pay | Admitting: Internal Medicine

## 2023-09-17 VITALS — BP 128/72 | HR 78 | Temp 98.2°F | Resp 16 | Ht 67.0 in | Wt 121.0 lb

## 2023-09-17 DIAGNOSIS — F330 Major depressive disorder, recurrent, mild: Secondary | ICD-10-CM

## 2023-09-17 DIAGNOSIS — L89509 Pressure ulcer of unspecified ankle, unspecified stage: Secondary | ICD-10-CM

## 2023-09-17 DIAGNOSIS — M7989 Other specified soft tissue disorders: Secondary | ICD-10-CM | POA: Diagnosis not present

## 2023-09-17 DIAGNOSIS — Z8719 Personal history of other diseases of the digestive system: Secondary | ICD-10-CM | POA: Diagnosis not present

## 2023-09-17 DIAGNOSIS — F419 Anxiety disorder, unspecified: Secondary | ICD-10-CM | POA: Diagnosis not present

## 2023-09-17 DIAGNOSIS — R0989 Other specified symptoms and signs involving the circulatory and respiratory systems: Secondary | ICD-10-CM | POA: Diagnosis not present

## 2023-09-17 DIAGNOSIS — E785 Hyperlipidemia, unspecified: Secondary | ICD-10-CM

## 2023-09-17 NOTE — Progress Notes (Signed)
 Subjective:    Patient ID: Dorothy Olson, female    DOB: 09-Sep-1935, 88 y.o.   MRN: [id number]  Patient here for  Chief Complaint  Patient presents with   Medical Management of Chronic Issues    HPI Here for a scheduled follow up - f/u regarding depression and hypercholesterolemia. She is accompanied by her daughter.  History obtained from both of them. Sees Dr Tere Felts. On remeron . Has seen Dr Mason Sole - mild cognitive impairment. Continues on memantine  XR 14mg  q day. She is eating well. Weight is up. She is having some lower extremity swelling. Has noticed increased swelling over the last 6-8 weeks. No change with breathing. No sob reported. No increased cough or congestion. No chest pain. No abdominal pain. Bowels moving on current regimen. Also has pressure sores - lateral ankles. She is doing better rotating when in bed. Also has started sitting in a recliner with legs elevated.    Past Medical History:  Diagnosis Date   Anxiety    Dementia (HCC)    Depression    Hyperlipidemia    Osteoarthritis    Past Surgical History:  Procedure Laterality Date   MINOR HEMORRHOIDECTOMY     TUBAL LIGATION     Family History  Problem Relation Age of Onset   Diabetes Mother    Stroke Father    Mental illness Neg Hx    Social History   Socioeconomic History   Marital status: Widowed    Spouse name: Not on file   Number of children: 2   Years of education: Not on file   Highest education level: 12th grade  Occupational History   Not on file  Tobacco Use   Smoking status: Never   Smokeless tobacco: Never  Vaping Use   Vaping status: Former  Substance and Sexual Activity   Alcohol use: Not Currently   Drug use: Not Currently   Sexual activity: Not Currently  Other Topics Concern   Not on file  Social History Narrative   widow   Social Drivers of Health   Financial Resource Strain: Low Risk  (05/16/2023)   Overall Financial Resource Strain (CARDIA)    Difficulty of Paying  Living Expenses: Not very hard  Food Insecurity: No Food Insecurity (05/16/2023)   Hunger Vital Sign    Worried About Running Out of Food in the Last Year: Never true    Ran Out of Food in the Last Year: Never true  Transportation Needs: No Transportation Needs (05/16/2023)   PRAPARE - Administrator, Civil Service (Medical): No    Lack of Transportation (Non-Medical): No  Physical Activity: Inactive (05/16/2023)   Exercise Vital Sign    Days of Exercise per Week: 0 days    Minutes of Exercise per Session: 0 min  Stress: No Stress Concern Present (05/16/2023)   [name]    Feeling of Stress : Only a little  Social Connections: Moderately Integrated (05/16/2023)   Social Connection and Isolation Panel [NHANES]    Frequency of Communication with Friends and Family: Once a week    Frequency of Social Gatherings with Friends and Family: More than three times a week    Attends Religious Services: More than 4 times per year    Active Member of [employer] or Organizations: Yes    Attends Banker Meetings: More than 4 times per year    Marital Status: Widowed     Review of Systems  Constitutional:  Negative for appetite change and unexpected weight change.  HENT:  Negative for congestion and sinus pressure.   Respiratory:  Negative for cough, chest tightness and shortness of breath.   Cardiovascular:  Positive for leg swelling. Negative for chest pain and palpitations.  Gastrointestinal:  Negative for abdominal pain, diarrhea, nausea and vomiting.  Genitourinary:  Negative for difficulty urinating and dysuria.  Musculoskeletal:  Negative for joint swelling and myalgias.  Skin:  Negative for color change and rash.  Neurological:  Negative for dizziness and headaches.  Psychiatric/Behavioral:  Negative for agitation and dysphoric mood.        Objective:     BP 128/72   Pulse 78   Temp 98.2 F (36.8  C)   Resp 16   Wt 121 lb (54.9 kg)   SpO2 99%   BMI 18.95 kg/m  Wt Readings from Last 3 Encounters:  09/17/23 121 lb (54.9 kg)  05/20/23 113 lb 9.6 oz (51.5 kg)  02/06/23 105 lb (47.6 kg)    Physical Exam Vitals reviewed.  Constitutional:      General: She is not in acute distress.    Appearance: Normal appearance.  HENT:     Head: Normocephalic and atraumatic.     Right Ear: External ear normal.     Left Ear: External ear normal.     Mouth/Throat:     Pharynx: No oropharyngeal exudate or posterior oropharyngeal erythema.  Eyes:     General: No scleral icterus.       Right eye: No discharge.        Left eye: No discharge.     Conjunctiva/sclera: Conjunctivae normal.  Neck:     Thyroid: No thyromegaly.  Cardiovascular:     Rate and Rhythm: Normal rate and regular rhythm.  Pulmonary:     Effort: No respiratory distress.     Breath sounds: Normal breath sounds. No wheezing.  Abdominal:     General: Bowel sounds are normal.     Palpations: Abdomen is soft.     Tenderness: There is no abdominal tenderness.  Musculoskeletal:        General: No tenderness.     Cervical back: Neck supple. No tenderness.     Comments: Pedal and lower extremity swelling. No increased erythema.  Unable to appreciate a good DP pulse  Lymphadenopathy:     Cervical: No cervical adenopathy.  Skin:    Findings: No erythema or rash.     Comments: Pressure wounds - lateral ankles.   Neurological:     Mental Status: She is alert.  Psychiatric:        Mood and Affect: Mood normal.        Behavior: Behavior normal.         Outpatient Encounter Medications as of 09/17/2023  Medication Sig   memantine  (NAMENDA  XR) 14 MG CP24 24 hr capsule Take 1 capsule (14 mg total) by mouth daily.   mirtazapine  (REMERON ) 15 MG tablet Take 3 tablets (45 mg total) by mouth daily before bed   Multiple Vitamins-Minerals (PRESERVISION AREDS) TABS Take 1 tablet by mouth daily.   VITAMIN E PO Take by mouth.   No  facility-administered encounter medications on file as of 09/17/2023.     Lab Results  Component Value Date   WBC 5.7 09/17/2023   HGB 13.0 09/17/2023   HCT 39.2 09/17/2023   PLT 264.0 09/17/2023   GLUCOSE 95 09/17/2023   CHOL 228 (H) 09/17/2023   TRIG 173.0 (H)  09/17/2023   HDL 45.90 09/17/2023   LDLCALC 148 (H) 09/17/2023   ALT 9 09/17/2023   AST 17 09/17/2023   NA 140 09/17/2023   K 4.2 09/17/2023   CL 103 09/17/2023   CREATININE 0.63 09/17/2023   BUN 22 09/17/2023   CO2 31 09/17/2023   TSH 2.63 09/17/2023    MR BRAIN W WO CONTRAST Result Date: 07/18/2021 CLINICAL DATA:  Progressive memory loss over several years EXAM: MRI HEAD WITHOUT AND WITH CONTRAST TECHNIQUE: Multiplanar, multiecho pulse sequences of the brain and surrounding structures were obtained without and with intravenous contrast. CONTRAST:  5mL GADAVIST  GADOBUTROL  1 MMOL/ML IV SOLN COMPARISON:  None. FINDINGS: Brain: There is no evidence of acute intracranial hemorrhage, extra-axial fluid collection, or acute infarct. There is mild global parenchymal volume loss with prominence of the ventricular system and extra-axial CSF spaces, within expected limits for age. There is no discernible lobar predominance. There is no disproportionate hippocampal atrophy. There is minimal FLAIR signal abnormality in the subcortical and periventricular white matter likely reflecting sequela of minimal chronic white matter microangiopathy. There is no suspicious parenchymal signal abnormality. There is no mass lesion. There is no abnormal enhancement. There is no mass effect or midline shift. Vascular: Normal flow voids. Skull and upper cervical spine: Normal marrow signal. Sinuses/Orbits: There is mild mucosal thickening in the right frontal sinus. Bilateral lens implants are in place. The globes and orbits are otherwise unremarkable. Other: None. IMPRESSION: Essentially normal for age brain MRI. Mild global parenchymal volume loss without  discernible lobar predominance or disproportionate hippocampal atrophy, and minimal chronic white matter microangiopathy. Electronically Signed   By: Eldora Greet M.D.   On: 07/18/2021 11:52       Assessment & Plan:  Hyperlipidemia, unspecified hyperlipidemia type Assessment & Plan: Follow lipid panel. Check today.   Orders: -     CBC with Differential/Platelet -     Basic metabolic panel with GFR -     Hepatic function panel -     Lipid panel -     TSH  Anxiety disorder, unspecified type Assessment & Plan: Has been seeing Dr Tere Felts. Continues on remeron .    History of constipation Assessment & Plan: Bowels stable on current regimen.    MDD (major depressive disorder), recurrent episode, mild (HCC) Assessment & Plan: Continues on remeron . Has been followed by Dr Tere Felts. Appears to be stable.    Swelling of lower extremity Assessment & Plan: Pedal and lower extremity swelling as outlined. Discussed leg elevation. Given inability to palpate a good DP pulse, will have [facility] evaluated.  Also, wound clinic to follow pressure lesions (ankle). Try to avoid direct pressure.   Orders: -     Ambulatory referral to Vascular Surgery -     Ambulatory referral to Wound Clinic  Pressure injury of skin of ankle, unspecified injury stage, unspecified laterality Assessment & Plan: Pressure lesions - bilateral lateral ankles. Keep pressure off area. Elevated legs. Will have wound clinic follow.   Orders: -     Ambulatory referral to Vascular Surgery -     Ambulatory referral to Wound Clinic  Diminished pulse Assessment & Plan: Noted diminished pulse (DP pulse). Will have [facility] evaluate - question of need for ABIs.   Orders: -     Ambulatory referral to Vascular Surgery -     Ambulatory referral to Wound Clinic     Dellar Fenton, MD

## 2023-09-18 ENCOUNTER — Encounter: Payer: Self-pay | Admitting: Internal Medicine

## 2023-09-18 ENCOUNTER — Ambulatory Visit: Payer: Self-pay | Admitting: Internal Medicine

## 2023-09-18 DIAGNOSIS — L89509 Pressure ulcer of unspecified ankle, unspecified stage: Secondary | ICD-10-CM | POA: Insufficient documentation

## 2023-09-18 DIAGNOSIS — M7989 Other specified soft tissue disorders: Secondary | ICD-10-CM | POA: Insufficient documentation

## 2023-09-18 DIAGNOSIS — R0989 Other specified symptoms and signs involving the circulatory and respiratory systems: Secondary | ICD-10-CM | POA: Insufficient documentation

## 2023-09-18 LAB — CBC WITH DIFFERENTIAL/PLATELET
Basophils Absolute: 0 10*3/uL (ref 0.0–0.1)
Basophils Relative: 0.7 % (ref 0.0–3.0)
Eosinophils Absolute: 0.1 10*3/uL (ref 0.0–0.7)
Eosinophils Relative: 1 % (ref 0.0–5.0)
HCT: 39.2 % (ref 36.0–46.0)
Hemoglobin: 13 g/dL (ref 12.0–15.0)
Lymphocytes Relative: 31.4 % (ref 12.0–46.0)
Lymphs Abs: 1.8 10*3/uL (ref 0.7–4.0)
MCHC: 33.1 g/dL (ref 30.0–36.0)
MCV: 87.4 fl (ref 78.0–100.0)
Monocytes Absolute: 0.7 10*3/uL (ref 0.1–1.0)
Monocytes Relative: 12.5 % — ABNORMAL HIGH (ref 3.0–12.0)
Neutro Abs: 3.1 10*3/uL (ref 1.4–7.7)
Neutrophils Relative %: 54.4 % (ref 43.0–77.0)
Platelets: 264 10*3/uL (ref 150.0–400.0)
RBC: 4.49 Mil/uL (ref 3.87–5.11)
RDW: 14.4 % (ref 11.5–15.5)
WBC: 5.7 10*3/uL (ref 4.0–10.5)

## 2023-09-18 LAB — LIPID PANEL
Cholesterol: 228 mg/dL — ABNORMAL HIGH (ref 0–200)
HDL: 45.9 mg/dL (ref >39.00)
LDL Cholesterol: 148 mg/dL — ABNORMAL HIGH (ref 0–99)
NonHDL: 182.56
Total CHOL/HDL Ratio: 5
Triglycerides: 173 mg/dL — ABNORMAL HIGH (ref 0.0–149.0)
VLDL: 34.6 mg/dL (ref 0.0–40.0)

## 2023-09-18 LAB — HEPATIC FUNCTION PANEL
ALT: 9 U/L (ref 0–35)
AST: 17 U/L (ref 0–37)
Albumin: 3.8 g/dL (ref 3.5–5.2)
Alkaline Phosphatase: 69 U/L (ref 39–117)
Bilirubin, Direct: 0 mg/dL (ref 0.0–0.3)
Total Bilirubin: 0.4 mg/dL (ref 0.2–1.2)
Total Protein: 7.1 g/dL (ref 6.0–8.3)

## 2023-09-18 LAB — BASIC METABOLIC PANEL WITH GFR
BUN: 22 mg/dL (ref 6–23)
CO2: 31 meq/L (ref 19–32)
Calcium: 9 mg/dL (ref 8.4–10.5)
Chloride: 103 meq/L (ref 96–112)
Creatinine, Ser: 0.63 mg/dL (ref 0.40–1.20)
GFR: 80.55 mL/min (ref >60.00)
Glucose, Bld: 95 mg/dL (ref 70–99)
Potassium: 4.2 meq/L (ref 3.5–5.1)
Sodium: 140 meq/L (ref 135–145)

## 2023-09-18 LAB — TSH: TSH: 2.63 u[IU]/mL (ref 0.35–5.50)

## 2023-09-18 NOTE — Assessment & Plan Note (Signed)
 Pedal and lower extremity swelling as outlined. Discussed leg elevation. Given inability to palpate a good DP pulse, will have [facility] evaluated.  Also, wound clinic to follow pressure lesions (ankle). Try to avoid direct pressure.

## 2023-09-18 NOTE — Assessment & Plan Note (Signed)
Follow lipid panel. Check today.  

## 2023-09-18 NOTE — Assessment & Plan Note (Signed)
 Pressure lesions - bilateral lateral ankles. Keep pressure off area. Elevated legs. Will have wound clinic follow.

## 2023-09-18 NOTE — Assessment & Plan Note (Signed)
 Noted diminished pulse (DP pulse). Will have [facility] evaluate - question of need for ABIs.

## 2023-09-18 NOTE — Assessment & Plan Note (Signed)
 Continues on remeron . Has been followed by Dr Tere Felts. Appears to be stable.

## 2023-09-18 NOTE — Assessment & Plan Note (Signed)
 Bowels stable on current regimen.

## 2023-09-18 NOTE — Assessment & Plan Note (Signed)
 Has been seeing Dr Tere Felts. Continues on remeron .

## 2023-10-12 ENCOUNTER — Other Ambulatory Visit: Payer: Self-pay

## 2023-10-14 ENCOUNTER — Other Ambulatory Visit: Payer: Self-pay

## 2023-10-22 ENCOUNTER — Encounter: Attending: Physician Assistant | Admitting: Physician Assistant

## 2023-10-22 DIAGNOSIS — F01A0 Vascular dementia, mild, without behavioral disturbance, psychotic disturbance, mood disturbance, and anxiety: Secondary | ICD-10-CM | POA: Insufficient documentation

## 2023-10-22 DIAGNOSIS — L89523 Pressure ulcer of left ankle, stage 3: Secondary | ICD-10-CM | POA: Diagnosis not present

## 2023-10-22 DIAGNOSIS — I87332 Chronic venous hypertension (idiopathic) with ulcer and inflammation of left lower extremity: Secondary | ICD-10-CM | POA: Insufficient documentation

## 2023-10-29 ENCOUNTER — Encounter: Attending: Physician Assistant | Admitting: Physician Assistant

## 2023-10-29 DIAGNOSIS — I87332 Chronic venous hypertension (idiopathic) with ulcer and inflammation of left lower extremity: Secondary | ICD-10-CM | POA: Diagnosis not present

## 2023-10-29 DIAGNOSIS — L89523 Pressure ulcer of left ankle, stage 3: Secondary | ICD-10-CM | POA: Insufficient documentation

## 2023-10-29 DIAGNOSIS — F01A0 Vascular dementia, mild, without behavioral disturbance, psychotic disturbance, mood disturbance, and anxiety: Secondary | ICD-10-CM | POA: Diagnosis not present

## 2023-11-08 ENCOUNTER — Other Ambulatory Visit (INDEPENDENT_AMBULATORY_CARE_PROVIDER_SITE_OTHER): Payer: Self-pay | Admitting: Vascular Surgery

## 2023-11-08 DIAGNOSIS — R0989 Other specified symptoms and signs involving the circulatory and respiratory systems: Secondary | ICD-10-CM

## 2023-11-08 DIAGNOSIS — M7989 Other specified soft tissue disorders: Secondary | ICD-10-CM

## 2023-11-12 ENCOUNTER — Other Ambulatory Visit (INDEPENDENT_AMBULATORY_CARE_PROVIDER_SITE_OTHER)

## 2023-11-12 ENCOUNTER — Ambulatory Visit (INDEPENDENT_AMBULATORY_CARE_PROVIDER_SITE_OTHER): Admitting: Vascular Surgery

## 2023-11-12 ENCOUNTER — Encounter (INDEPENDENT_AMBULATORY_CARE_PROVIDER_SITE_OTHER): Payer: Self-pay | Admitting: Vascular Surgery

## 2023-11-12 VITALS — BP 135/77 | HR 75 | Ht 67.0 in | Wt 125.0 lb

## 2023-11-12 DIAGNOSIS — M7989 Other specified soft tissue disorders: Secondary | ICD-10-CM

## 2023-11-12 DIAGNOSIS — L97221 Non-pressure chronic ulcer of left calf limited to breakdown of skin: Secondary | ICD-10-CM

## 2023-11-12 DIAGNOSIS — R0989 Other specified symptoms and signs involving the circulatory and respiratory systems: Secondary | ICD-10-CM

## 2023-11-12 NOTE — Progress Notes (Signed)
 Patient ID: Dorothy Olson, female   DOB: 08-Dec-1935, 88 y.o.   MRN: [id number]  Chief Complaint  Patient presents with   np. Reflux + consult. LE swelling. dimished pulse. bilat pr    HPI Dorothy Olson is a 88 y.o. female.  I am asked to see the patient by Dr. Glendia for evaluation of leg swelling, left lower extremity ulceration, and diminished pedal pulses.  The patient had a left lateral calf wound develop with swelling on the left lower extremity.  She went to the wound care center where they were able to relatively quickly get the wound healed up.  It got her swelling under markedly improved control with the daily use of compression socks which she is wearing today.  No known history of DVT or superficial thrombophlebitis to her knowledge.  She does have a previous history of a major trauma and injury with a severe ankle fracture on the anterior and lateral portion of the right ankle many years ago.  Venous reflux study was performed today showing no evidence of DVT or superficial thrombophlebitis.  No superficial venous reflux was identified.  She did have deep venous reflux in the left femoral vein in the right common femoral and popliteal veins.     Past Medical History:  Diagnosis Date   Anxiety    Dementia (HCC)    Depression    Hyperlipidemia    Osteoarthritis     Past Surgical History:  Procedure Laterality Date   MINOR HEMORRHOIDECTOMY     TUBAL LIGATION       Family History  Problem Relation Age of Onset   Diabetes Mother    Stroke Father    Mental illness Neg Hx       Social History   Tobacco Use   Smoking status: Never   Smokeless tobacco: Never  Vaping Use   Vaping status: Former  Substance Use Topics   Alcohol use: Not Currently   Drug use: Not Currently     Allergies  Allergen Reactions   Amoxicillin-Pot Clavulanate Other (See Comments)   Citalopram Hydrobromide Nausea And Vomiting and Nausea Only   Escitalopram Oxalate Other (See Comments)    Pristiq  [Desvenlafaxine Succinate Er] Other (See Comments)   Amoxicillin Rash   Bupropion Other (See Comments) and Nausea Only   Citalopram Nausea Only   Sulfa Antibiotics Rash    Current Outpatient Medications  Medication Sig Dispense Refill   memantine  (NAMENDA  XR) 14 MG CP24 24 hr capsule Take 1 capsule (14 mg total) by mouth daily. 90 capsule 3   mirtazapine  (REMERON ) 15 MG tablet Take 3 tablets (45 mg total) by mouth daily before bed 270 tablet 1   Multiple Vitamins-Minerals (PRESERVISION AREDS) TABS Take 1 tablet by mouth daily.     VITAMIN E PO Take by mouth.     No current facility-administered medications for this visit.      REVIEW OF SYSTEMS (Negative unless checked)  Constitutional: [] Weight loss  [] Fever  [] Chills Cardiac: [] Chest pain   [] Chest pressure   [] Palpitations   [] Shortness of breath when laying flat   [] Shortness of breath at rest   [] Shortness of breath with exertion. Vascular:  [] Pain in legs with walking   [] Pain in legs at rest   [] Pain in legs when laying flat   [] Claudication   [] Pain in feet when walking  [] Pain in feet at rest  [] Pain in feet when laying flat   [] History of DVT   [] Phlebitis   [  x]Swelling in legs   [] Varicose veins   [] Non-healing ulcers Pulmonary:   [] Uses home oxygen   [] Productive cough   [] Hemoptysis   [] Wheeze  [] COPD   [] Asthma Neurologic:  [] Dizziness  [] Blackouts   [] Seizures   [] History of stroke   [] History of TIA  [] Aphasia   [] Temporary blindness   [] Dysphagia   [] Weakness or numbness in arms   [] Weakness or numbness in legs Musculoskeletal:  [] Arthritis   [] Joint swelling   [] Joint pain   [] Low back pain Hematologic:  [] Easy bruising  [] Easy bleeding   [] Hypercoagulable state   [] Anemic  [] Hepatitis Gastrointestinal:  [] Blood in stool   [] Vomiting blood  [] Gastroesophageal reflux/heartburn   [] Abdominal pain Genitourinary:  [] Chronic kidney disease   [] Difficult urination  [] Frequent urination  [] Burning with urination    [] Hematuria Skin:  [] Rashes   [x] Ulcers   [x] Wounds Psychological:  [x] History of anxiety   [x]  History of major depression.    Physical Exam BP 135/77   Pulse 75   Ht 5' 7 (1.702 m)   Wt 125 lb (56.7 kg)   BMI 19.58 kg/m  Gen:  WD/WN, NAD. Appears younger than stated age. Head: [place]/AT, No temporalis wasting.  Ear/Nose/Throat: Hearing grossly intact, nares w/o erythema or drainage, oropharynx w/o Erythema/Exudate Eyes: Conjunctiva clear, sclera non-icteric  Neck: trachea midline.  No JVD.  Pulmonary:  Good air movement, respirations not labored, no use of accessory muscles  Cardiac: RRR, no JVD Vascular:  Vessel Right Left  Radial Palpable Palpable                          PT 2+ 1+  DP NP 2+   Gastrointestinal:. No masses, surgical incisions, or scars. Musculoskeletal: M/S 5/5 throughout.  Extremities without ischemic changes.  No deformity or atrophy.  Previous wound has healed.  No significant lower extremity edema is present today. Neurologic: Sensation grossly intact in extremities.  Symmetrical.  Speech is fluent. Motor exam as listed above. Psychiatric: Judgment intact, Mood & affect appropriate for pt's clinical situation. Dermatologic: No rashes or ulcers noted.  No cellulitis or open wounds.    Radiology No results found.  Labs Recent Results (from the past 2160 hours)  CBC with Differential/Platelet     Status: Abnormal   Collection Time: 09/17/23  4:03 PM  Result Value Ref Range   WBC 5.7 4.0 - 10.5 K/uL   RBC 4.49 3.87 - 5.11 Mil/uL   Hemoglobin 13.0 12.0 - 15.0 g/dL   HCT 60.7 63.9 - 53.9 %   MCV 87.4 78.0 - 100.0 fl   MCHC 33.1 30.0 - 36.0 g/dL   RDW 85.5 88.4 - 84.4 %   Platelets 264.0 150.0 - 400.0 K/uL   Neutrophils Relative % 54.4 43.0 - 77.0 %   Lymphocytes Relative 31.4 12.0 - 46.0 %   Monocytes Relative 12.5 (H) 3.0 - 12.0 %   Eosinophils Relative 1.0 0.0 - 5.0 %   Basophils Relative 0.7 0.0 - 3.0 %   Neutro Abs 3.1 1.4 - 7.7 K/uL    Lymphs Abs 1.8 0.7 - 4.0 K/uL   Monocytes Absolute 0.7 0.1 - 1.0 K/uL   Eosinophils Absolute 0.1 0.0 - 0.7 K/uL   Basophils Absolute 0.0 0.0 - 0.1 K/uL  Basic metabolic panel with GFR     Status: None   Collection Time: 09/17/23  4:03 PM  Result Value Ref Range   Sodium 140 135 - 145 mEq/L  Potassium 4.2 3.5 - 5.1 mEq/L   Chloride 103 96 - 112 mEq/L   CO2 31 19 - 32 mEq/L   Glucose, Bld 95 70 - 99 mg/dL   BUN 22 6 - 23 mg/dL   Creatinine, Ser 9.36 0.40 - 1.20 mg/dL   GFR 19.44 >39.99 mL/min    Comment: Calculated using the CKD-EPI Creatinine Equation (2021)   Calcium 9.0 8.4 - 10.5 mg/dL  Hepatic function panel     Status: None   Collection Time: 09/17/23  4:03 PM  Result Value Ref Range   Total Bilirubin 0.4 0.2 - 1.2 mg/dL   Bilirubin, Direct 0.0 0.0 - 0.3 mg/dL   Alkaline Phosphatase 69 39 - 117 U/L   AST 17 0 - 37 U/L   ALT 9 0 - 35 U/L   Total Protein 7.1 6.0 - 8.3 g/dL   Albumin 3.8 3.5 - 5.2 g/dL  Lipid panel     Status: Abnormal   Collection Time: 09/17/23  4:03 PM  Result Value Ref Range   Cholesterol 228 (H) 0 - 200 mg/dL    Comment: ATP III Classification       Desirable:  < 200 mg/dL               Borderline High:  200 - 239 mg/dL          High:  > = 759 mg/dL   Triglycerides 826.9 (H) 0.0 - 149.0 mg/dL    Comment: Normal:  <849 mg/dLBorderline High:  150 - 199 mg/dL   HDL 54.09 >60.99 mg/dL   VLDL 65.3 0.0 - 59.9 mg/dL   LDL Cholesterol 851 (H) 0 - 99 mg/dL   Total CHOL/HDL Ratio 5     Comment:                Men          Women1/2 Average Risk     3.4          3.3Average Risk          5.0          4.42X Average Risk          9.6          7.13X Average Risk          15.0          11.0                       NonHDL 182.56     Comment: NOTE:  Non-HDL goal should be 30 mg/dL higher than patient's LDL goal (i.e. LDL goal of < 70 mg/dL, would have non-HDL goal of < 100 mg/dL)  TSH     Status: None   Collection Time: 09/17/23  4:03 PM  Result Value Ref Range   TSH  2.63 0.35 - 5.50 uIU/mL    Assessment/Plan:  Lower limb ulcer, calf, left, limited to breakdown of skin (HCC) This has now healed under the direction of the wound care center. Venous reflux study was performed today showing no evidence of DVT or superficial thrombophlebitis.  No superficial venous reflux was identified.  She did have deep venous reflux in the left femoral vein in the right common femoral and popliteal veins.  No venous intervention would be of benefit.  Would continue compression socks, elevation, and exercise to keep the swelling down.  Diminished pulse Her right dorsalis pedis pulses diminished, but given the trauma to that leg  it is likely that it is now no longer providing flow to the foot.  She has no arterial insufficiency symptoms of concern and a good palpable posterior tibial pulse.  No further workup will be planned unless she develops significant arterial insufficiency symptoms.  Swelling of lower extremity Venous reflux study was performed today showing no evidence of DVT or superficial thrombophlebitis.  No superficial venous reflux was identified.  She did have deep venous reflux in the left femoral vein in the right common femoral and popliteal veins.   Symptom control is much better with compression socks and elevation.  No role for interval mention.  Contact our office with worsening symptoms.      Selinda Gu 11/12/2023, 4:03 PM   This note was created with Dragon medical transcription system.  Any errors from dictation are unintentional.

## 2023-11-12 NOTE — Assessment & Plan Note (Signed)
 Venous reflux study was performed today showing no evidence of DVT or superficial thrombophlebitis.  No superficial venous reflux was identified.  She did have deep venous reflux in the left femoral vein in the right common femoral and popliteal veins.   Symptom control is much better with compression socks and elevation.  No role for interval mention.  Contact our office with worsening symptoms.

## 2023-11-12 NOTE — Assessment & Plan Note (Signed)
 This has now healed under the direction of the wound care center. Venous reflux study was performed today showing no evidence of DVT or superficial thrombophlebitis.  No superficial venous reflux was identified.  She did have deep venous reflux in the left femoral vein in the right common femoral and popliteal veins.  No venous intervention would be of benefit.  Would continue compression socks, elevation, and exercise to keep the swelling down.

## 2023-11-12 NOTE — Assessment & Plan Note (Signed)
 Her right dorsalis pedis pulses diminished, but given the trauma to that leg it is likely that it is now no longer providing flow to the foot.  She has no arterial insufficiency symptoms of concern and a good palpable posterior tibial pulse.  No further workup will be planned unless she develops significant arterial insufficiency symptoms.

## 2024-01-09 ENCOUNTER — Other Ambulatory Visit: Payer: Self-pay

## 2024-01-21 DIAGNOSIS — H31103 Choroidal degeneration, unspecified, bilateral: Secondary | ICD-10-CM | POA: Diagnosis not present

## 2024-01-21 DIAGNOSIS — H442D2 Degenerative myopia with foveoschisis, left eye: Secondary | ICD-10-CM | POA: Diagnosis not present

## 2024-01-21 DIAGNOSIS — H442E1 Degenerative myopia with other maculopathy, right eye: Secondary | ICD-10-CM | POA: Diagnosis not present

## 2024-01-23 ENCOUNTER — Ambulatory Visit: Admitting: Internal Medicine

## 2024-01-23 VITALS — BP 120/68 | HR 86 | Resp 16 | Ht 67.0 in | Wt 128.0 lb

## 2024-01-23 DIAGNOSIS — F419 Anxiety disorder, unspecified: Secondary | ICD-10-CM

## 2024-01-23 DIAGNOSIS — R0989 Other specified symptoms and signs involving the circulatory and respiratory systems: Secondary | ICD-10-CM | POA: Diagnosis not present

## 2024-01-23 DIAGNOSIS — E785 Hyperlipidemia, unspecified: Secondary | ICD-10-CM

## 2024-01-23 DIAGNOSIS — Z0000 Encounter for general adult medical examination without abnormal findings: Secondary | ICD-10-CM | POA: Diagnosis not present

## 2024-01-23 DIAGNOSIS — L89509 Pressure ulcer of unspecified ankle, unspecified stage: Secondary | ICD-10-CM | POA: Diagnosis not present

## 2024-01-23 DIAGNOSIS — F330 Major depressive disorder, recurrent, mild: Secondary | ICD-10-CM

## 2024-01-23 NOTE — Assessment & Plan Note (Signed)
 Physical today 01/23/24.  Declines mammogram.

## 2024-01-23 NOTE — Progress Notes (Signed)
 Subjective:    Patient ID: Margarett Viti, female    DOB: 05/13/1935, 88 y.o.   MRN: [id number]  Patient here for  Chief Complaint  Patient presents with   Annual Exam    HPI Here for a physical exam. She is accompanied by her daughter. History obtained from both of them. Sees Dr Coby. On remeron . Has seen Dr Maree - mild cognitive impairment. Continues on memantine  XR 14mg  q day. Wound clinic - healing of ankle ulcer. Saw [facility] 11/12/23 - venous reflux study - no DVT or superficial thrombophlebitis. No superficial venous reflux. Deep venous reflux in the left femoral vein in the right common femoral and popliteal veins. Recommended compression hose and elevation. Swelling is better. Wound better/healed. Eating better. Weight up some. Overall doing better.    Past Medical History:  Diagnosis Date   Anxiety    Dementia (HCC)    Depression    Hyperlipidemia    Osteoarthritis    Past Surgical History:  Procedure Laterality Date   MINOR HEMORRHOIDECTOMY     TUBAL LIGATION     Family History  Problem Relation Age of Onset   Diabetes Mother    Stroke Father    Mental illness Neg Hx    Social History   Socioeconomic History   Marital status: Widowed    Spouse name: Not on file   Number of children: 2   Years of education: Not on file   Highest education level: 12th grade  Occupational History   Not on file  Tobacco Use   Smoking status: Never   Smokeless tobacco: Never  Vaping Use   Vaping status: Former  Substance and Sexual Activity   Alcohol use: Not Currently   Drug use: Not Currently   Sexual activity: Not Currently  Other Topics Concern   Not on file  Social History Narrative   widow   Social Drivers of Health   Financial Resource Strain: Low Risk  (05/16/2023)   Overall Financial Resource Strain (CARDIA)    Difficulty of Paying Living Expenses: Not very hard  Food Insecurity: No Food Insecurity (05/16/2023)   Hunger Vital Sign    Worried About Running Out  of Food in the Last Year: Never true    Ran Out of Food in the Last Year: Never true  Transportation Needs: No Transportation Needs (05/16/2023)   PRAPARE - Administrator, Civil Service (Medical): No    Lack of Transportation (Non-Medical): No  Physical Activity: Inactive (05/16/2023)   Exercise Vital Sign    Days of Exercise per Week: 0 days    Minutes of Exercise per Session: 0 min  Stress: No Stress Concern Present (05/16/2023)   [name]    Feeling of Stress : Only a little  Social Connections: Moderately Integrated (05/16/2023)   Social Connection and Isolation Panel    Frequency of Communication with Friends and Family: Once a week    Frequency of Social Gatherings with Friends and Family: More than three times a week    Attends Religious Services: More than 4 times per year    Active Member of [employer] or Organizations: Yes    Attends Banker Meetings: More than 4 times per year    Marital Status: Widowed     Review of Systems  Constitutional:  Negative for appetite change and unexpected weight change.  HENT:  Negative for congestion, sinus pressure and sore throat.   Eyes:  Negative for pain and visual disturbance.  Respiratory:  Negative for cough, chest tightness and shortness of breath.   Cardiovascular:  Negative for chest pain and palpitations.       Leg swelling improved.   Gastrointestinal:  Negative for abdominal pain, diarrhea, nausea and vomiting.  Genitourinary:  Negative for difficulty urinating and dysuria.  Musculoskeletal:  Negative for joint swelling and myalgias.  Skin:  Negative for color change and rash.  Neurological:  Negative for dizziness and headaches.  Hematological:  Negative for adenopathy. Does not bruise/bleed easily.  Psychiatric/Behavioral:  Negative for agitation and dysphoric mood.        Objective:     BP 120/68   Pulse 86   Resp 16   Ht 5' 7  (1.702 m)   Wt 128 lb (58.1 kg)   SpO2 98%   BMI 20.05 kg/m  Wt Readings from Last 3 Encounters:  01/23/24 128 lb (58.1 kg)  11/12/23 125 lb (56.7 kg)  09/17/23 121 lb (54.9 kg)    Physical Exam Vitals reviewed.  Constitutional:      General: She is not in acute distress.    Appearance: Normal appearance.  HENT:     Head: Normocephalic and atraumatic.     Right Ear: External ear normal.     Left Ear: External ear normal.     Mouth/Throat:     Pharynx: No oropharyngeal exudate or posterior oropharyngeal erythema.  Eyes:     General: No scleral icterus.       Right eye: No discharge.        Left eye: No discharge.     Conjunctiva/sclera: Conjunctivae normal.  Neck:     Thyroid: No thyromegaly.  Cardiovascular:     Rate and Rhythm: Normal rate and regular rhythm.  Pulmonary:     Effort: No respiratory distress.     Breath sounds: Normal breath sounds. No wheezing.  Abdominal:     General: Bowel sounds are normal.     Palpations: Abdomen is soft.     Tenderness: There is no abdominal tenderness.  Musculoskeletal:        General: No swelling or tenderness.     Cervical back: Neck supple. No tenderness.  Lymphadenopathy:     Cervical: No cervical adenopathy.  Skin:    Findings: No erythema or rash.  Neurological:     Mental Status: She is alert.  Psychiatric:        Mood and Affect: Mood normal.        Behavior: Behavior normal.         Outpatient Encounter Medications as of 01/23/2024  Medication Sig   memantine  (NAMENDA  XR) 14 MG CP24 24 hr capsule Take 1 capsule (14 mg total) by mouth daily.   mirtazapine  (REMERON ) 15 MG tablet Take 3 tablets (45 mg total) by mouth daily before bed   Multiple Vitamins-Minerals (PRESERVISION AREDS) TABS Take 1 tablet by mouth daily.   VITAMIN E PO Take by mouth.   No facility-administered encounter medications on file as of 01/23/2024.     Lab Results  Component Value Date   WBC 5.7 09/17/2023   HGB 13.0 09/17/2023    HCT 39.2 09/17/2023   PLT 264.0 09/17/2023   GLUCOSE 96 01/23/2024   CHOL 243 (H) 01/23/2024   TRIG 390.0 (H) 01/23/2024   HDL 39.80 01/23/2024   LDLCALC 126 (H) 01/23/2024   ALT 10 01/23/2024   AST 17 01/23/2024   NA 140 01/23/2024   K  4.0 01/23/2024   CL 102 01/23/2024   CREATININE 0.85 01/23/2024   BUN 25 (H) 01/23/2024   CO2 33 (H) 01/23/2024   TSH 2.63 09/17/2023    MR BRAIN W WO CONTRAST Result Date: 07/18/2021 CLINICAL DATA:  Progressive memory loss over several years EXAM: MRI HEAD WITHOUT AND WITH CONTRAST TECHNIQUE: Multiplanar, multiecho pulse sequences of the brain and surrounding structures were obtained without and with intravenous contrast. CONTRAST:  5mL GADAVIST  GADOBUTROL  1 MMOL/ML IV SOLN COMPARISON:  None. FINDINGS: Brain: There is no evidence of acute intracranial hemorrhage, extra-axial fluid collection, or acute infarct. There is mild global parenchymal volume loss with prominence of the ventricular system and extra-axial CSF spaces, within expected limits for age. There is no discernible lobar predominance. There is no disproportionate hippocampal atrophy. There is minimal FLAIR signal abnormality in the subcortical and periventricular white matter likely reflecting sequela of minimal chronic white matter microangiopathy. There is no suspicious parenchymal signal abnormality. There is no mass lesion. There is no abnormal enhancement. There is no mass effect or midline shift. Vascular: Normal flow voids. Skull and upper cervical spine: Normal marrow signal. Sinuses/Orbits: There is mild mucosal thickening in the right frontal sinus. Bilateral lens implants are in place. The globes and orbits are otherwise unremarkable. Other: None. IMPRESSION: Essentially normal for age brain MRI. Mild global parenchymal volume loss without discernible lobar predominance or disproportionate hippocampal atrophy, and minimal chronic white matter microangiopathy. Electronically Signed   By:  Maude Harry M.D.   On: 07/18/2021 11:52       Assessment & Plan:  Routine general medical examination at a health care facility  Healthcare maintenance Assessment & Plan: Physical today 01/23/24.  Declines mammogram.    Hyperlipidemia, unspecified hyperlipidemia type Assessment & Plan: Follow lipid panel. Check today.   Orders: -     Basic metabolic panel with GFR -     Hepatic function panel -     Lipid panel  Pressure injury of skin of ankle, unspecified injury stage, unspecified laterality Assessment & Plan: Saw vascular/wound clinic. Healed.    MDD (major depressive disorder), recurrent episode, mild Assessment & Plan: Continues on remeron . Has been followed by Dr Coby. Overall appears to be doing better. Eating better. Follow.    Diminished pulse Assessment & Plan: Saw vascular. Reviewed note - Her right dorsalis pedis pulses diminished, but given the trauma to that leg it is likely that it is now no longer providing flow to the foot. She has no arterial insufficiency symptoms of concern and a good palpable posterior tibial pulse. No further workup will be planned unless she develops significant arterial insufficiency symptoms. No pain. Follow.    Anxiety disorder, unspecified type Assessment & Plan: Overall appears to be doing well. Has seen Dr Coby. On remeron . Follow.       Allena Hamilton, MD

## 2024-01-24 LAB — HEPATIC FUNCTION PANEL
ALT: 10 U/L (ref 0–35)
AST: 17 U/L (ref 0–37)
Albumin: 3.7 g/dL (ref 3.5–5.2)
Alkaline Phosphatase: 75 U/L (ref 39–117)
Bilirubin, Direct: 0 mg/dL (ref 0.0–0.3)
Total Bilirubin: 0.3 mg/dL (ref 0.2–1.2)
Total Protein: 7 g/dL (ref 6.0–8.3)

## 2024-01-24 LAB — LIPID PANEL
Cholesterol: 243 mg/dL — ABNORMAL HIGH (ref 0–200)
HDL: 39.8 mg/dL (ref >39.00)
LDL Cholesterol: 126 mg/dL — ABNORMAL HIGH (ref 0–99)
NonHDL: 203.59
Total CHOL/HDL Ratio: 6
Triglycerides: 390 mg/dL — ABNORMAL HIGH (ref 0.0–149.0)
VLDL: 78 mg/dL — ABNORMAL HIGH (ref 0.0–40.0)

## 2024-01-24 LAB — BASIC METABOLIC PANEL WITH GFR
BUN: 25 mg/dL — ABNORMAL HIGH (ref 6–23)
CO2: 33 meq/L — ABNORMAL HIGH (ref 19–32)
Calcium: 9.1 mg/dL (ref 8.4–10.5)
Chloride: 102 meq/L (ref 96–112)
Creatinine, Ser: 0.85 mg/dL (ref 0.40–1.20)
GFR: 62.05 mL/min (ref >60.00)
Glucose, Bld: 96 mg/dL (ref 70–99)
Potassium: 4 meq/L (ref 3.5–5.1)
Sodium: 140 meq/L (ref 135–145)

## 2024-01-27 ENCOUNTER — Encounter: Payer: Self-pay | Admitting: Internal Medicine

## 2024-01-27 ENCOUNTER — Ambulatory Visit: Payer: Self-pay | Admitting: Internal Medicine

## 2024-01-27 NOTE — Assessment & Plan Note (Signed)
 Continues on remeron . Has been followed by Dr Coby. Overall appears to be doing better. Eating better. Follow.

## 2024-01-27 NOTE — Assessment & Plan Note (Signed)
 Saw vascular/wound clinic. Healed.

## 2024-01-27 NOTE — Assessment & Plan Note (Signed)
Follow lipid panel. Check today.  

## 2024-01-27 NOTE — Assessment & Plan Note (Signed)
 Overall appears to be doing well. Has seen Dr Coby. On remeron . Follow.

## 2024-01-27 NOTE — Assessment & Plan Note (Signed)
 Saw vascular. Reviewed note - Her right dorsalis pedis pulses diminished, but given the trauma to that leg it is likely that it is now no longer providing flow to the foot. She has no arterial insufficiency symptoms of concern and a good palpable posterior tibial pulse. No further workup will be planned unless she develops significant arterial insufficiency symptoms. No pain. Follow.

## 2024-02-11 ENCOUNTER — Ambulatory Visit: Payer: Medicare HMO | Admitting: *Deleted

## 2024-02-11 VITALS — Ht 67.0 in | Wt 128.0 lb

## 2024-02-11 DIAGNOSIS — Z0000 Encounter for general adult medical examination without abnormal findings: Secondary | ICD-10-CM

## 2024-02-11 NOTE — Patient Instructions (Signed)
 Dorothy Olson,  Thank you for taking the time for your Medicare Wellness Visit. I appreciate your continued commitment to your health goals. Please review the care plan we discussed, and feel free to reach out if I can assist you further.  Medicare recommends these wellness visits once per year to help you and your care team stay ahead of potential health issues. These visits are designed to focus on prevention, allowing your provider to concentrate on managing your acute and chronic conditions during your regular appointments.  Please note that Annual Wellness Visits do not include a physical exam. Some assessments may be limited, especially if the visit was conducted virtually. If needed, we may recommend a separate in-person follow-up with your provider.  Ongoing Care Seeing your primary care provider every 3 to 6 months helps us  monitor your health and provide consistent, personalized care.  Consider updating your vaccine  Referrals If a referral was made during today's visit and you haven't received any updates within two weeks, please contact the referred provider directly to check on the status.  Recommended Screenings:  Health Maintenance  Topic Date Due   Zoster (Shingles) Vaccine (1 of 2) Never done   COVID-19 Vaccine (1 - 2025-26 season) Never done   Pneumococcal Vaccine for age over 39 (2 of 2 - PCV20 or PCV21) 05/19/2024*   DEXA scan (bone density measurement)  05/19/2024*   Flu Shot  07/28/2024*   DTaP/Tdap/Td vaccine (1 - Tdap) 09/16/2024*   Medicare Annual Wellness Visit  02/10/2025   Meningitis B Vaccine  Aged Out  *Topic was postponed. The date shown is not the original due date.       02/11/2024    3:53 PM  Advanced Directives  Does Patient Have a Medical Advance Directive? Yes  Type of Estate agent of [place];Living will  Copy of Healthcare Power of Attorney in Chart? No - copy requested   Advance Care Planning is important because  it: Ensures you receive medical care that aligns with your values, goals, and preferences. Provides guidance to your family and loved ones, reducing the emotional burden of decision-making during critical moments.  Vision: Annual vision screenings are recommended for early detection of glaucoma, cataracts, and diabetic retinopathy. These exams can also reveal signs of chronic conditions such as diabetes and high blood pressure.  Dental: Annual dental screenings help detect early signs of oral cancer, gum disease, and other conditions linked to overall health, including heart disease and diabetes.  Please see the attached documents for additional preventive care recommendations.

## 2024-02-11 NOTE — Progress Notes (Signed)
 Subjective:   Dorothy Olson is a 88 y.o. who presents for a Medicare Wellness preventive visit.  As a reminder, Annual Wellness Visits don't include a physical exam, and some assessments may be limited, especially if this visit is performed virtually. We may recommend an in-person follow-up visit with your provider if needed.  Visit Complete: Virtual I connected with  [name] on 02/11/24 by a audio enabled telemedicine application and verified that I am speaking with the correct person using two identifiers.  Patient Location: Home  Provider Location: Home Office  I discussed the limitations of evaluation and management by telemedicine. The patient expressed understanding and agreed to proceed.  Vital Signs: Because this visit was a virtual/telehealth visit, some criteria may be missing or patient reported. Any vitals not documented were not able to be obtained and vitals that have been documented are patient reported.  VideoDeclined- This patient declined Librarian, academic. Therefore the visit was completed with audio only.  Persons Participating in Visit: Patient assisted by daughter [name] .  AWV Questionnaire: Yes: Patient Medicare AWV questionnaire was completed by the patient on 02/10/24; I have confirmed that all information answered by patient is correct and no changes since this date.  Cardiac Risk Factors include: advanced age (>31men, >65 women);dyslipidemia     Objective:    Today's Vitals   02/11/24 1538  Weight: 128 lb (58.1 kg)  Height: 5' 7 (1.702 m)   Body mass index is 20.05 kg/m.     02/11/2024    3:53 PM 02/06/2023    4:07 PM  Advanced Directives  Does Patient Have a Medical Advance Directive? Yes Yes  Type of Estate agent of [place];Living will Healthcare Power of [place];Living will  Copy of Healthcare Power of Attorney in Chart? No - copy requested No - copy requested    Current  Medications (verified) Outpatient Encounter Medications as of 02/11/2024  Medication Sig   memantine  (NAMENDA  XR) 14 MG CP24 24 hr capsule Take 1 capsule (14 mg total) by mouth daily.   mirtazapine  (REMERON ) 15 MG tablet Take 3 tablets (45 mg total) by mouth daily before bed   Multiple Vitamins-Minerals (MACULAR HEALTH FORMULA PO) Take by mouth daily.   VITAMIN E PO Take by mouth.   [DISCONTINUED] Multiple Vitamins-Minerals (PRESERVISION AREDS) TABS Take 1 tablet by mouth daily. (Patient not taking: Reported on 02/11/2024)   No facility-administered encounter medications on file as of 02/11/2024.    Allergies (verified) Amoxicillin-pot clavulanate, Citalopram hydrobromide, Escitalopram oxalate, Pristiq  [desvenlafaxine succinate er], Amoxicillin, Bupropion, Citalopram, and Sulfa antibiotics   History: Past Medical History:  Diagnosis Date   Anxiety    Dementia (HCC)    Depression    Hyperlipidemia    Osteoarthritis    Past Surgical History:  Procedure Laterality Date   MINOR HEMORRHOIDECTOMY     TUBAL LIGATION     Family History  Problem Relation Age of Onset   Diabetes Mother    Stroke Father    Mental illness Neg Hx    Social History   Socioeconomic History   Marital status: Widowed    Spouse name: Not on file   Number of children: 2   Years of education: Not on file   Highest education level: 12th grade  Occupational History   Not on file  Tobacco Use   Smoking status: Never   Smokeless tobacco: Never  Vaping Use   Vaping status: Former  Substance and Sexual Activity  Alcohol use: Not Currently   Drug use: Not Currently   Sexual activity: Not Currently  Other Topics Concern   Not on file  Social History Narrative   widow   Social Drivers of Health   Financial Resource Strain: Low Risk  (02/10/2024)   Overall Financial Resource Strain (CARDIA)    Difficulty of Paying Living Expenses: Not hard at all  Food Insecurity: No Food Insecurity (02/10/2024)    Hunger Vital Sign    Worried About Running Out of Food in the Last Year: Never true    Ran Out of Food in the Last Year: Never true  Transportation Needs: No Transportation Needs (02/10/2024)   PRAPARE - Administrator, Civil Service (Medical): No    Lack of Transportation (Non-Medical): No  Physical Activity: Inactive (02/10/2024)   Exercise Vital Sign    Days of Exercise per Week: 0 days    Minutes of Exercise per Session: 0 min  Stress: No Stress Concern Present (02/10/2024)   [name]    Feeling of Stress: Only a little  Social Connections: Socially Isolated (02/10/2024)   Social Connection and Isolation Panel    Frequency of Communication with Friends and Family: Twice a week    Frequency of Social Gatherings with Friends and Family: More than three times a week    Attends Religious Services: Never    Database administrator or Organizations: No    Attends Banker Meetings: Never    Marital Status: Widowed    Tobacco Counseling Counseling given: Not Answered    Clinical Intake:  Pre-visit preparation completed: Yes  Pain : No/denies pain     BMI - recorded: 20.05 Nutritional Status: BMI of 19-24  Normal Nutritional Risks: None Diabetes: No  No results found for: HGBA1C   How often do you need to have someone help you when you read instructions, pamphlets, or other written materials from your doctor or pharmacy?: 1 - Never  Interpreter Needed?: No  Information entered by :: R. Makynzee Tigges LPN   Activities of Daily Living     02/10/2024    8:24 AM  In your present state of health, do you have any difficulty performing the following activities:  Hearing? 0  Vision? 0  Difficulty concentrating or making decisions? 1  Walking or climbing stairs? 0  Dressing or bathing? 1  Doing errands, shopping? 1  Preparing Food and eating ? Y  Using the Toilet? Y  In the past six  months, have you accidently leaked urine? N  Do you have problems with loss of bowel control? Y  Managing your Medications? Y  Managing your Finances? Y  Housekeeping or managing your Housekeeping? Y    Patient Care Team: Glendia Shad, MD as PCP - General (Internal Medicine) Marea Selinda RAMAN, MD as Referring Physician (Vascular Surgery) Paich, Kaitlin, PA-C (Physician Assistant) Eappen, Saramma, MD as Consulting Physician (Psychiatry)  I have updated your Care Teams any recent Medical Services you may have received from other providers in the past year.     Assessment:   This is a routine wellness examination for Dorothy Olson.  Hearing/Vision screen Hearing Screening - Comments:: Some issues  not interested in hearing test Vision Screening - Comments:: glasses   Goals Addressed             This Visit's Progress    Patient Stated       Wants to continue to eat  well       Depression Screen     02/11/2024    3:48 PM 01/23/2024    4:05 PM 09/17/2023    4:01 PM 05/20/2023    3:41 PM 02/06/2023    3:58 PM 01/17/2023    2:34 PM 10/08/2022   11:35 AM  PHQ 2/9 Scores  PHQ - 2 Score 0 0 3 4 1 1  0  PHQ- 9 Score 1 0 9 8 4 6  0    Fall Risk     02/10/2024    8:24 AM 02/05/2023   12:17 PM 01/17/2023    2:34 PM 12/30/2022    8:50 AM 10/08/2022   11:35 AM  Fall Risk   Falls in the past year? 0 0 0 0 0  Number falls in past yr: 0 0 0  0  Injury with Fall? 0 0 0  0  Risk for fall due to : No Fall Risks No Fall Risks No Fall Risks;History of fall(s)  No Fall Risks  Follow up Falls evaluation completed;Falls prevention discussed Falls prevention discussed;Falls evaluation completed   Falls evaluation completed    MEDICARE RISK AT HOME:  Medicare Risk at Home Any stairs in or around the home?: (Patient-Rptd) Yes If so, are there any without handrails?: (Patient-Rptd) No Home free of loose throw rugs in walkways, pet beds, electrical cords, etc?: (Patient-Rptd) Yes Adequate lighting in  your home to reduce risk of falls?: (Patient-Rptd) Yes Life alert?: (Patient-Rptd) Yes Use of a cane, walker or w/c?: (Patient-Rptd) No Grab bars in the bathroom?: (Patient-Rptd) No Shower chair or bench in shower?: (Patient-Rptd) Yes Elevated toilet seat or a handicapped toilet?: (Patient-Rptd) No  TIMED UP AND GO:  Was the test performed?  No  Cognitive Function: 6CIT completed        02/11/2024    3:53 PM 02/06/2023    4:07 PM  6CIT Screen  What Year? 0 points 0 points  What month? 0 points 0 points  What time? 0 points 0 points  Count back from 20 0 points 0 points  Months in reverse 0 points 0 points  Repeat phrase 0 points 10 points  Total Score 0 points 10 points    Immunizations Immunization History  Administered Date(s) Administered   INFLUENZA, HIGH DOSE SEASONAL PF 02/04/2014, 01/30/2016, 02/28/2017, 01/29/2018, 02/10/2019, 02/09/2020   Influenza Split 06/05/2012, 01/28/2013   Influenza,inj,Quad PF,6+ Mos 02/03/2018   Pneumococcal Conjugate-13 05/04/2013    Screening Tests Health Maintenance  Topic Date Due   Zoster Vaccines- Shingrix (1 of 2) Never done   COVID-19 Vaccine (1 - 2025-26 season) Never done   Medicare Annual Wellness (AWV)  02/06/2024   Pneumococcal Vaccine: 50+ Years (2 of 2 - PCV20 or PCV21) 05/19/2024 (Originally 05/04/2014)   DEXA SCAN  05/19/2024 (Originally 09/09/2002)   Influenza Vaccine  07/28/2024 (Originally 11/29/2023)   DTaP/Tdap/Td (1 - Tdap) 09/16/2024 (Originally 09/08/1954)   Meningococcal B Vaccine  Aged Out    Health Maintenance Items Addressed: Patient declines vaccines  Additional Screening:  Vision Screening: Recommended annual ophthalmology exams for early detection of glaucoma and other disorders of the eye. Is the patient up to date with their annual eye exam?  Yes  Who is the provider or what is the name of the office in which the patient attends annual eye exams?  Dr. Laurice  Dental Screening: Recommended annual  dental exams for proper oral hygiene  Community Resource Referral / Chronic Care Management: CRR required this visit?  No   CCM required this visit?  No   Plan:    I have personally reviewed and noted the following in the patient's chart:   Medical and social history Use of alcohol, tobacco or illicit drugs  Current medications and supplements including opioid prescriptions. Patient is not currently taking opioid prescriptions. Functional ability and status Nutritional status Physical activity Advanced directives List of other physicians Hospitalizations, surgeries, and ER visits in previous 12 months Vitals Screenings to include cognitive, depression, and falls Referrals and appointments  In addition, I have reviewed and discussed with patient certain preventive protocols, quality metrics, and best practice recommendations. A written personalized care plan for preventive services as well as general preventive health recommendations were provided to patient.   Angeline Fredericks, LPN   [id number]   After Visit Summary: (MyChart) Due to this being a telephonic visit, the after visit summary with patients personalized plan was offered to patient via MyChart   Notes: Nothing significant to report at this time.

## 2024-02-12 ENCOUNTER — Other Ambulatory Visit: Payer: Self-pay | Admitting: Internal Medicine

## 2024-02-12 DIAGNOSIS — F320 Major depressive disorder, single episode, mild: Secondary | ICD-10-CM

## 2024-02-12 DIAGNOSIS — F419 Anxiety disorder, unspecified: Secondary | ICD-10-CM

## 2024-02-13 ENCOUNTER — Other Ambulatory Visit: Payer: Self-pay

## 2024-02-13 MED ORDER — MIRTAZAPINE 15 MG PO TABS
45.0000 mg | ORAL_TABLET | Freq: Every day | ORAL | 1 refills | Status: DC
  Filled 2024-02-13: qty 270, 90d supply, fill #0
  Filled 2024-05-17: qty 270, 90d supply, fill #1

## 2024-04-08 ENCOUNTER — Other Ambulatory Visit: Payer: Self-pay

## 2024-04-08 ENCOUNTER — Other Ambulatory Visit: Payer: Self-pay | Admitting: Internal Medicine

## 2024-04-08 MED ORDER — MEMANTINE HCL ER 14 MG PO CP24
14.0000 mg | ORAL_CAPSULE | Freq: Every day | ORAL | 3 refills | Status: AC
  Filled 2024-04-08: qty 90, 90d supply, fill #0

## 2024-05-18 ENCOUNTER — Other Ambulatory Visit: Payer: Self-pay

## 2024-05-26 ENCOUNTER — Ambulatory Visit: Admitting: Internal Medicine

## 2024-05-26 ENCOUNTER — Encounter: Payer: Self-pay | Admitting: Internal Medicine

## 2024-05-26 VITALS — BP 130/80 | HR 78 | Temp 98.5°F | Ht 67.0 in | Wt 129.0 lb

## 2024-05-26 DIAGNOSIS — M7989 Other specified soft tissue disorders: Secondary | ICD-10-CM

## 2024-05-26 DIAGNOSIS — F419 Anxiety disorder, unspecified: Secondary | ICD-10-CM

## 2024-05-26 DIAGNOSIS — E785 Hyperlipidemia, unspecified: Secondary | ICD-10-CM

## 2024-05-26 DIAGNOSIS — F330 Major depressive disorder, recurrent, mild: Secondary | ICD-10-CM | POA: Diagnosis not present

## 2024-05-26 DIAGNOSIS — F320 Major depressive disorder, single episode, mild: Secondary | ICD-10-CM | POA: Diagnosis not present

## 2024-05-26 NOTE — Progress Notes (Signed)
 "  Subjective:    Patient ID: Dorothy Olson, female    DOB: 08/25/1935, 89 y.o.   MRN: [id number]  Patient here for  Chief Complaint  Patient presents with   Medical Management of Chronic Issues    HPI Here for a scheduled follow up.  She is accompanied by her daughter. History obtained from both of them. Has seen Dr Coby. On remeron . Tolerating this dose well. Eating well. Overall stable. Has seen Dr Maree - mild cognitive impairment. Continues on memantine  XR 14mg  q day.  Saw [facility] 11/12/23 - venous reflux study - no DVT or superficial thrombophlebitis. No superficial venous reflux. Deep venous reflux in the left femoral vein in the right common femoral and popliteal veins. Recommended compression hose and elevation. No significant swelling now. Breathing stable. No abdominal pain or bowel change.    Past Medical History:  Diagnosis Date   Allergy 1980   Anxiety    Cataract    Dementia (HCC)    Depression    Hyperlipidemia    Osteoarthritis    Past Surgical History:  Procedure Laterality Date   EYE SURGERY  2017   Bilateral cataracts removed   MINOR HEMORRHOIDECTOMY     TUBAL LIGATION     Family History  Problem Relation Age of Onset   Diabetes Mother    Stroke Mother    Stroke Father    Mental illness Neg Hx    Social History   Socioeconomic History   Marital status: Widowed    Spouse name: Not on file   Number of children: 2   Years of education: Not on file   Highest education level: 12th grade  Occupational History   Not on file  Tobacco Use   Smoking status: Never   Smokeless tobacco: Never  Vaping Use   Vaping status: Former  Substance and Sexual Activity   Alcohol use: Not Currently   Drug use: Not Currently   Sexual activity: Not Currently  Other Topics Concern   Not on file  Social History Narrative   widow   Social Drivers of Health   Tobacco Use: Low Risk (05/31/2024)   Patient History    Smoking Tobacco Use: Never    Smokeless Tobacco Use:  Never    Passive Exposure: Not on file  Financial Resource Strain: Low Risk (02/10/2024)   Overall Financial Resource Strain (CARDIA)    Difficulty of Paying Living Expenses: Not hard at all  Food Insecurity: No Food Insecurity (02/10/2024)   Epic    Worried About Programme Researcher, Broadcasting/film/video in the Last Year: Never true    Ran Out of Food in the Last Year: Never true  Transportation Needs: No Transportation Needs (02/10/2024)   Epic    Lack of Transportation (Medical): No    Lack of Transportation (Non-Medical): No  Physical Activity: Inactive (02/10/2024)   Exercise Vital Sign    Days of Exercise per Week: 0 days    Minutes of Exercise per Session: 0 min  Stress: No Stress Concern Present (02/10/2024)   [name]    Feeling of Stress: Only a little  Social Connections: Socially Isolated (02/10/2024)   Social Connection and Isolation Panel    Frequency of Communication with Friends and Family: Twice a week    Frequency of Social Gatherings with Friends and Family: More than three times a week    Attends Religious Services: Never    Database Administrator or  Organizations: No    Attends Banker Meetings: Never    Marital Status: Widowed  Depression (PHQ2-9): Low Risk (05/26/2024)   Depression (PHQ2-9)    PHQ-2 Score: 0  Alcohol Screen: Low Risk (02/10/2024)   Alcohol Screen    Last Alcohol Screening Score (AUDIT): 0  Housing: Unknown (02/10/2024)   Epic    Unable to Pay for Housing in the Last Year: No    Number of Times Moved in the Last Year: Not on file    Homeless in the Last Year: No  Utilities: Not At Risk (02/11/2024)   Epic    Threatened with loss of utilities: No  Health Literacy: Inadequate Health Literacy (02/11/2024)   B1300 Health Literacy    Frequency of need for help with medical instructions: Often     Review of Systems  Constitutional:  Negative for appetite change and unexpected  weight change.  HENT:  Negative for congestion and sinus pressure.   Respiratory:  Negative for cough, chest tightness and shortness of breath.   Cardiovascular:  Negative for chest pain and palpitations.       No increased swelling.   Gastrointestinal:  Negative for abdominal pain, diarrhea, nausea and vomiting.  Genitourinary:  Negative for difficulty urinating and dysuria.  Musculoskeletal:  Negative for joint swelling and myalgias.  Skin:  Negative for color change and rash.  Neurological:  Negative for dizziness and headaches.  Psychiatric/Behavioral:  Negative for agitation and dysphoric mood.        Objective:     BP 130/80   Pulse 78   Temp 98.5 F (36.9 C) (Oral)   Ht 5' 7 (1.702 m)   Wt 129 lb (58.5 kg)   SpO2 97%   BMI 20.20 kg/m  Wt Readings from Last 3 Encounters:  05/26/24 129 lb (58.5 kg)  02/11/24 128 lb (58.1 kg)  01/23/24 128 lb (58.1 kg)    Physical Exam Vitals reviewed.  Constitutional:      General: She is not in acute distress.    Appearance: Normal appearance.  HENT:     Head: Normocephalic and atraumatic.     Right Ear: External ear normal.     Left Ear: External ear normal.     Mouth/Throat:     Pharynx: No oropharyngeal exudate or posterior oropharyngeal erythema.  Eyes:     General: No scleral icterus.       Right eye: No discharge.        Left eye: No discharge.     Conjunctiva/sclera: Conjunctivae normal.  Neck:     Thyroid: No thyromegaly.  Cardiovascular:     Rate and Rhythm: Normal rate and regular rhythm.  Pulmonary:     Effort: No respiratory distress.     Breath sounds: Normal breath sounds. No wheezing.  Abdominal:     General: Bowel sounds are normal.     Palpations: Abdomen is soft.     Tenderness: There is no abdominal tenderness.  Musculoskeletal:        General: No swelling or tenderness.     Cervical back: Neck supple. No tenderness.  Lymphadenopathy:     Cervical: No cervical adenopathy.  Skin:    Findings:  No erythema or rash.  Neurological:     Mental Status: She is alert.  Psychiatric:        Mood and Affect: Mood normal.        Behavior: Behavior normal.         Outpatient Encounter  Medications as of 05/26/2024  Medication Sig   memantine  (NAMENDA  XR) 14 MG CP24 24 hr capsule Take 1 capsule (14 mg total) by mouth daily.   mirtazapine  (REMERON ) 15 MG tablet Take 3 tablets (45 mg total) by mouth daily before bed   Multiple Vitamins-Minerals (MACULAR HEALTH FORMULA PO) Take by mouth daily.   VITAMIN E PO Take by mouth.   [DISCONTINUED] mirtazapine  (REMERON ) 15 MG tablet Take 3 tablets (45 mg total) by mouth daily before bed   No facility-administered encounter medications on file as of 05/26/2024.     Lab Results  Component Value Date   WBC 5.7 09/17/2023   HGB 13.0 09/17/2023   HCT 39.2 09/17/2023   PLT 264.0 09/17/2023   GLUCOSE 101 (H) 05/26/2024   CHOL 220 (H) 05/26/2024   TRIG 257.0 (H) 05/26/2024   HDL 39.60 05/26/2024   LDLCALC 129 (H) 05/26/2024   ALT 10 05/26/2024   AST 18 05/26/2024   NA 143 05/26/2024   K 4.1 05/26/2024   CL 105 05/26/2024   CREATININE 0.74 05/26/2024   BUN 26 (H) 05/26/2024   CO2 31 05/26/2024   TSH 2.63 09/17/2023    MR BRAIN W WO CONTRAST Result Date: 07/18/2021 CLINICAL DATA:  Progressive memory loss over several years EXAM: MRI HEAD WITHOUT AND WITH CONTRAST TECHNIQUE: Multiplanar, multiecho pulse sequences of the brain and surrounding structures were obtained without and with intravenous contrast. CONTRAST:  5mL GADAVIST  GADOBUTROL  1 MMOL/ML IV SOLN COMPARISON:  None. FINDINGS: Brain: There is no evidence of acute intracranial hemorrhage, extra-axial fluid collection, or acute infarct. There is mild global parenchymal volume loss with prominence of the ventricular system and extra-axial CSF spaces, within expected limits for age. There is no discernible lobar predominance. There is no disproportionate hippocampal atrophy. There is minimal  FLAIR signal abnormality in the subcortical and periventricular white matter likely reflecting sequela of minimal chronic white matter microangiopathy. There is no suspicious parenchymal signal abnormality. There is no mass lesion. There is no abnormal enhancement. There is no mass effect or midline shift. Vascular: Normal flow voids. Skull and upper cervical spine: Normal marrow signal. Sinuses/Orbits: There is mild mucosal thickening in the right frontal sinus. Bilateral lens implants are in place. The globes and orbits are otherwise unremarkable. Other: None. IMPRESSION: Essentially normal for age brain MRI. Mild global parenchymal volume loss without discernible lobar predominance or disproportionate hippocampal atrophy, and minimal chronic white matter microangiopathy. Electronically Signed   By: Maude Harry M.D.   On: 07/18/2021 11:52       Assessment & Plan:  Swelling of lower extremity Assessment & Plan:  Saw [facility] 11/12/23 - venous reflux study - no DVT or superficial thrombophlebitis. No superficial venous reflux. Deep venous reflux in the left femoral vein in the right common femoral and popliteal veins. Recommended compression hose and elevation. No significant swelling now.    Hyperlipidemia, unspecified hyperlipidemia type Assessment & Plan: Follow lipid panel. Check with labs today.   Orders: -     Lipid panel -     Hepatic function panel -     Basic metabolic panel with GFR  MDD (major depressive disorder), recurrent episode, mild Assessment & Plan: Continues on remeron . Has been followed by Dr Coby. Overall appears to be doing better. Eating. Weight stable.    Other orders -     Mirtazapine ; Take 3 tablets (45 mg total) by mouth daily before bed  Dispense: 270 tablet; Refill: 1     Keighan Amezcua  [place], MD "

## 2024-05-27 ENCOUNTER — Ambulatory Visit: Payer: Self-pay | Admitting: Internal Medicine

## 2024-05-27 LAB — BASIC METABOLIC PANEL WITH GFR
BUN: 26 mg/dL — ABNORMAL HIGH (ref 6–23)
CO2: 31 meq/L (ref 19–32)
Calcium: 9.1 mg/dL (ref 8.4–10.5)
Chloride: 105 meq/L (ref 96–112)
Creatinine, Ser: 0.74 mg/dL (ref 0.40–1.20)
GFR: 73.11 mL/min
Glucose, Bld: 101 mg/dL — ABNORMAL HIGH (ref 70–99)
Potassium: 4.1 meq/L (ref 3.5–5.1)
Sodium: 143 meq/L (ref 135–145)

## 2024-05-27 LAB — HEPATIC FUNCTION PANEL
ALT: 10 U/L (ref 3–35)
AST: 18 U/L (ref 5–37)
Albumin: 3.8 g/dL (ref 3.5–5.2)
Alkaline Phosphatase: 85 U/L (ref 39–117)
Bilirubin, Direct: 0.1 mg/dL (ref 0.1–0.3)
Total Bilirubin: 0.4 mg/dL (ref 0.2–1.2)
Total Protein: 6.9 g/dL (ref 6.0–8.3)

## 2024-05-27 LAB — LIPID PANEL
Cholesterol: 220 mg/dL — ABNORMAL HIGH (ref 28–200)
HDL: 39.6 mg/dL
LDL Cholesterol: 129 mg/dL — ABNORMAL HIGH (ref 10–99)
NonHDL: 180.65
Total CHOL/HDL Ratio: 6
Triglycerides: 257 mg/dL — ABNORMAL HIGH (ref 10.0–149.0)
VLDL: 51.4 mg/dL — ABNORMAL HIGH (ref 0.0–40.0)

## 2024-05-31 ENCOUNTER — Encounter: Payer: Self-pay | Admitting: Internal Medicine

## 2024-05-31 ENCOUNTER — Other Ambulatory Visit: Payer: Self-pay

## 2024-05-31 MED ORDER — MIRTAZAPINE 15 MG PO TABS
45.0000 mg | ORAL_TABLET | Freq: Every day | ORAL | 1 refills | Status: AC
  Filled 2024-05-31: qty 270, 90d supply, fill #0

## 2024-05-31 NOTE — Assessment & Plan Note (Signed)
 Continues on remeron . Has been followed by Dr Coby. Overall appears to be doing better. Eating. Weight stable.

## 2024-05-31 NOTE — Assessment & Plan Note (Signed)
"   Saw [facility] 11/12/23 - venous reflux study - no DVT or superficial thrombophlebitis. No superficial venous reflux. Deep venous reflux in the left femoral vein in the right common femoral and popliteal veins. Recommended compression hose and elevation. No significant swelling now.  "

## 2024-09-24 ENCOUNTER — Ambulatory Visit: Admitting: Internal Medicine

## 2025-02-16 ENCOUNTER — Ambulatory Visit
